# Patient Record
Sex: Male | Born: 1965 | Race: Black or African American | Hispanic: No | Marital: Married | Smoking: Current every day smoker
Health system: Southern US, Community
[De-identification: ages and names within clinical notes are randomized; demographics above are authoritative.]

## PROBLEM LIST (undated history)

## (undated) DIAGNOSIS — I10 Essential (primary) hypertension: Secondary | ICD-10-CM

## (undated) DIAGNOSIS — E119 Type 2 diabetes mellitus without complications: Secondary | ICD-10-CM

## (undated) HISTORY — DX: Essential (primary) hypertension: I10

## (undated) HISTORY — PX: HERNIA REPAIR: SHX51

---

## 2019-11-20 DIAGNOSIS — E119 Type 2 diabetes mellitus without complications: Secondary | ICD-10-CM | POA: Insufficient documentation

## 2019-11-20 DIAGNOSIS — E349 Endocrine disorder, unspecified: Secondary | ICD-10-CM | POA: Insufficient documentation

## 2019-11-23 DIAGNOSIS — N529 Male erectile dysfunction, unspecified: Secondary | ICD-10-CM | POA: Insufficient documentation

## 2019-11-23 DIAGNOSIS — M545 Low back pain, unspecified: Secondary | ICD-10-CM | POA: Insufficient documentation

## 2020-03-31 ENCOUNTER — Other Ambulatory Visit: Payer: Self-pay

## 2020-03-31 ENCOUNTER — Ambulatory Visit
Admission: EM | Admit: 2020-03-31 | Discharge: 2020-03-31 | Disposition: A | Discharge status: Home / Self Care | Payer: BC Managed Care – PPO

## 2020-03-31 ENCOUNTER — Encounter: Payer: Self-pay | Admitting: Emergency Medicine

## 2020-03-31 DIAGNOSIS — M25472 Effusion, left ankle: Secondary | ICD-10-CM

## 2020-03-31 DIAGNOSIS — M25572 Pain in left ankle and joints of left foot: Secondary | ICD-10-CM | POA: Diagnosis not present

## 2020-03-31 HISTORY — DX: Type 2 diabetes mellitus without complications: E11.9

## 2020-03-31 MED ORDER — NAPROXEN 250 MG PO TABS: 500.0000 mg | ORAL_TABLET | Freq: Two times a day (BID) | ORAL | 0 refills | Status: AC

## 2020-03-31 MED ORDER — METHYLPREDNISOLONE SODIUM SUCC 125 MG IJ SOLR
125.0000 mg | Freq: Once | INTRAMUSCULAR | Status: AC
  Administered 2020-03-31: 125 mg via INTRAMUSCULAR

## 2020-03-31 NOTE — ED Notes (Signed)
Patient able to ambulate independently  

## 2020-03-31 NOTE — ED Triage Notes (Signed)
Pt presents to [facility] for assessment of left foot pain, top of foot and at ankle joint, x 3 days.  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  Patient states only pain when bearing weight.

## 2020-03-31 NOTE — ED Provider Notes (Signed)
[facility]    CSN: [id number] Arrival date & time: 03/31/20  1018      History   Chief Complaint Chief Complaint  Patient presents with  . Foot Pain    HPI Evan Jones is a 54 y.o. male with history of type 2 diabetes presenting for left foot pain and swelling.  States has been going for last 3 days.  Denies inciting event, injury.  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  No recent change in diet, medications, lifestyle.  Denies high purine rich diet.  No history of gout or diuretic use.  No numbness.  Has not tried any for this.   Past Medical History:  Diagnosis Date  . Diabetes mellitus without complication (HCC)     There are no problems to display for this patient.   Past Surgical History:  Procedure Laterality Date  . HERNIA REPAIR     2013       Home Medications    Prior to Admission medications   Medication Sig Start Date End Date Taking? Authorizing Provider  metFORMIN (GLUCOPHAGE) 500 MG tablet Take by mouth 2 (two) times daily with a meal.   Yes [provider]  naproxen (NAPROSYN) 250 MG tablet Take 2 tablets (500 mg total) by mouth 2 (two) times daily with a meal. 03/31/20   Hall-Potvin, [place], PA-C    Family History Family History  Problem Relation Age of Onset  . Healthy Mother   . Healthy Father     Social History Social History   Tobacco Use  . Smoking status: Current Every Day Smoker    Packs/day: 0.50  . Smokeless tobacco: Never Used  Substance Use Topics  . Alcohol use: Not Currently  . Drug use: Never     Allergies   Patient has no known allergies.   Review of Systems As per HPI   Physical Exam Triage Vital Signs ED Triage Vitals  Enc Vitals Group     BP 03/31/20 1117 (!) 137/91     Pulse Rate 03/31/20 1117 67     Resp 03/31/20 1117 18     Temp 03/31/20 1117 98 F (36.7 C)     Temp Source 03/31/20 1117 Temporal     SpO2  03/31/20 1117 93 %     Weight --      Height --      Head Circumference --      Peak Flow --      Pain Score 03/31/20 1116 6     Pain Loc --      Pain Edu? --      Excl. in GC? --    No data found.  Updated Vital Signs BP (!) 137/91 (BP Location: Left Arm)   Pulse 67   Temp 98 F (36.7 C) (Temporal)   Resp 18   SpO2 93%   Visual Acuity Right Eye Distance:   Left Eye Distance:   Bilateral Distance:    Right Eye Near:   Left Eye Near:    Bilateral Near:     Physical Exam Constitutional:      General: He is not in acute distress. HENT:     Head: Normocephalic and atraumatic.  Eyes:     General: No scleral icterus.    Pupils: Pupils are equal, round, and reactive to light.  Cardiovascular:     Rate and Rhythm: Normal rate.  Pulmonary:  Effort: Pulmonary effort is normal. No respiratory distress.     Breath sounds: No wheezing.  Musculoskeletal:        General: Swelling and tenderness present.     Comments: Slightly decreased dorsiflexion in left ankle as compared to right second pain.  Patient has mild left great toe MTP tenderness without swelling.  Patient does have proximal dorsal foot tenderness with mild erythema, warmth.  No crepitus, bruising.  Skin:    Coloration: Skin is not jaundiced or pale.     Findings: Erythema present.  Neurological:     Mental Status: He is alert and oriented to person, place, and time.      UC Treatments / Results  Labs (all labs ordered are listed, but only abnormal results are displayed) Labs Reviewed - No data to display  EKG   Radiology No results found.  Procedures Procedures (including critical care time)  Medications Ordered in UC Medications  methylPREDNISolone sodium succinate (SOLU-MEDROL) 125 mg/2 mL injection 125 mg (has no administration in time range)    Initial Impression / Assessment and Plan / UC Course  I have reviewed the triage vital signs and the nursing notes.  Pertinent labs & imaging  results that were available during my care of the patient were reviewed by me and considered in my medical decision making (see chart for details).     Patient without trauma or injury, bony deformity or tenderness: Radiography deferred at this time.  ? Tendinitis VS gout VS pseudogout.  Given Solu-Medrol in office which he tolerated well.  Provided orthopedic contact information if needed.  Return precautions discussed, pt verbalized understanding and is agreeable to plan. Final Clinical Impressions(s) / UC Diagnoses   Final diagnoses:  Pain and swelling of left ankle     Discharge Instructions     RICE: rest, ice, compression, elevation as needed for pain.   Cold therapy (ice packs) can be used to help swelling both after injury and after prolonged use of areas of chronic pain/aches.  Pain medication:  500 mg Naprosyn/Aleve (naproxen) every 12 hours with food:  AVOID other NSAIDs while taking this (may have Tylenol).  Important to follow up with specialist(s) below for further evaluation/management if your symptoms persist or worsen.    ED Prescriptions    Medication Sig Dispense Auth. Provider   naproxen (NAPROSYN) 250 MG tablet Take 2 tablets (500 mg total) by mouth 2 (two) times daily with a meal. 28 tablet Hall-Potvin, [place], PA-C     PDMP not reviewed this encounter.   Hall-Potvin, [place], [place] 03/31/20 1211

## 2020-03-31 NOTE — Discharge Instructions (Signed)
RICE: rest, ice, compression, elevation as needed for pain.   Cold therapy (ice packs) can be used to help swelling both after injury and after prolonged use of areas of chronic pain/aches.  Pain medication:  500 mg Naprosyn/Aleve (naproxen) every 12 hours with food:  AVOID other NSAIDs while taking this (may have Tylenol).  Important to follow up with specialist(s) below for further evaluation/management if your symptoms persist or worsen. 

## 2020-04-19 ENCOUNTER — Emergency Department (HOSPITAL_COMMUNITY): Payer: BC Managed Care – PPO

## 2020-04-19 ENCOUNTER — Encounter (HOSPITAL_COMMUNITY): Payer: Self-pay | Admitting: Emergency Medicine

## 2020-04-19 ENCOUNTER — Emergency Department (HOSPITAL_COMMUNITY)
Admission: EM | Admit: 2020-04-19 | Discharge: 2020-04-19 | Disposition: A | Discharge status: Home / Self Care | Payer: BC Managed Care – PPO | Attending: Emergency Medicine | Admitting: Emergency Medicine

## 2020-04-19 DIAGNOSIS — S39012A Strain of muscle, fascia and tendon of lower back, initial encounter: Secondary | ICD-10-CM | POA: Diagnosis not present

## 2020-04-19 DIAGNOSIS — Y999 Unspecified external cause status: Secondary | ICD-10-CM | POA: Diagnosis not present

## 2020-04-19 DIAGNOSIS — Y939 Activity, unspecified: Secondary | ICD-10-CM | POA: Diagnosis not present

## 2020-04-19 DIAGNOSIS — F17200 Nicotine dependence, unspecified, uncomplicated: Secondary | ICD-10-CM | POA: Diagnosis not present

## 2020-04-19 DIAGNOSIS — Y92410 Unspecified street and highway as the place of occurrence of the external cause: Secondary | ICD-10-CM | POA: Diagnosis not present

## 2020-04-19 DIAGNOSIS — E119 Type 2 diabetes mellitus without complications: Secondary | ICD-10-CM | POA: Insufficient documentation

## 2020-04-19 DIAGNOSIS — S8001XA Contusion of right knee, initial encounter: Secondary | ICD-10-CM | POA: Insufficient documentation

## 2020-04-19 DIAGNOSIS — Z7984 Long term (current) use of oral hypoglycemic drugs: Secondary | ICD-10-CM | POA: Diagnosis not present

## 2020-04-19 DIAGNOSIS — Z79899 Other long term (current) drug therapy: Secondary | ICD-10-CM | POA: Insufficient documentation

## 2020-04-19 DIAGNOSIS — S80911A Unspecified superficial injury of right knee, initial encounter: Secondary | ICD-10-CM | POA: Diagnosis present

## 2020-04-19 LAB — CBG MONITORING, ED: Glucose-Capillary: 130 mg/dL — ABNORMAL HIGH (ref 70–99)

## 2020-04-19 IMAGING — CR DG LUMBAR SPINE COMPLETE 4+V
5 series · 5 of 5 positions shown · non-contrast
Comparison: None.

CLINICAL DATA: Posttraumatic lower back pain

EXAM:
LUMBAR SPINE - COMPLETE 4+ VIEW

[t lumbar spine ap]
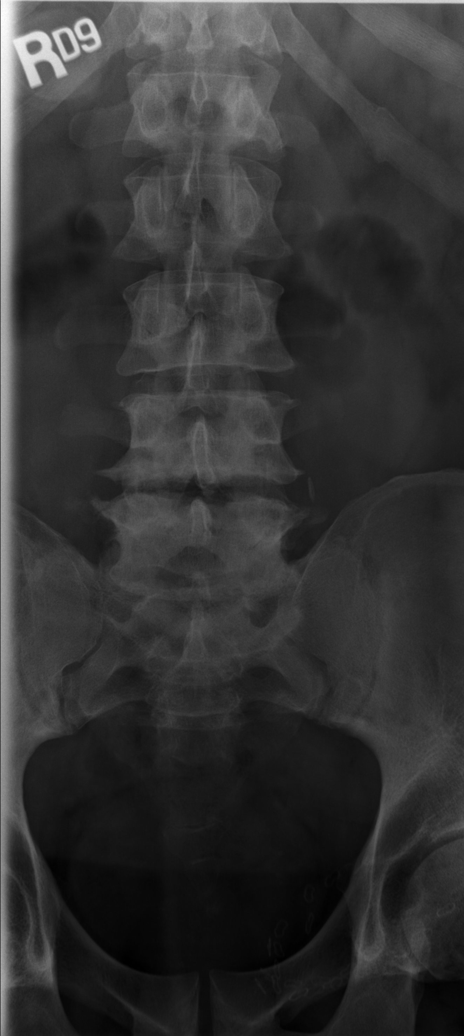

[t lumbar spine obl (1 of 2)]
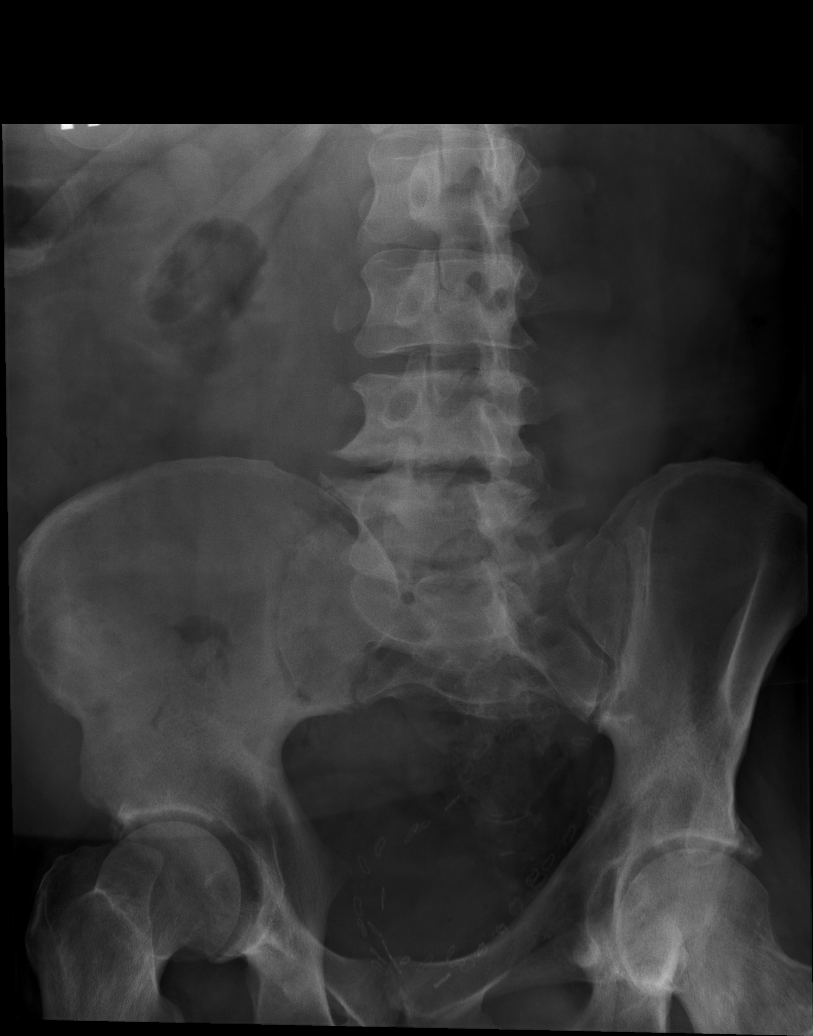

[t lumbar spine obl (2 of 2)]
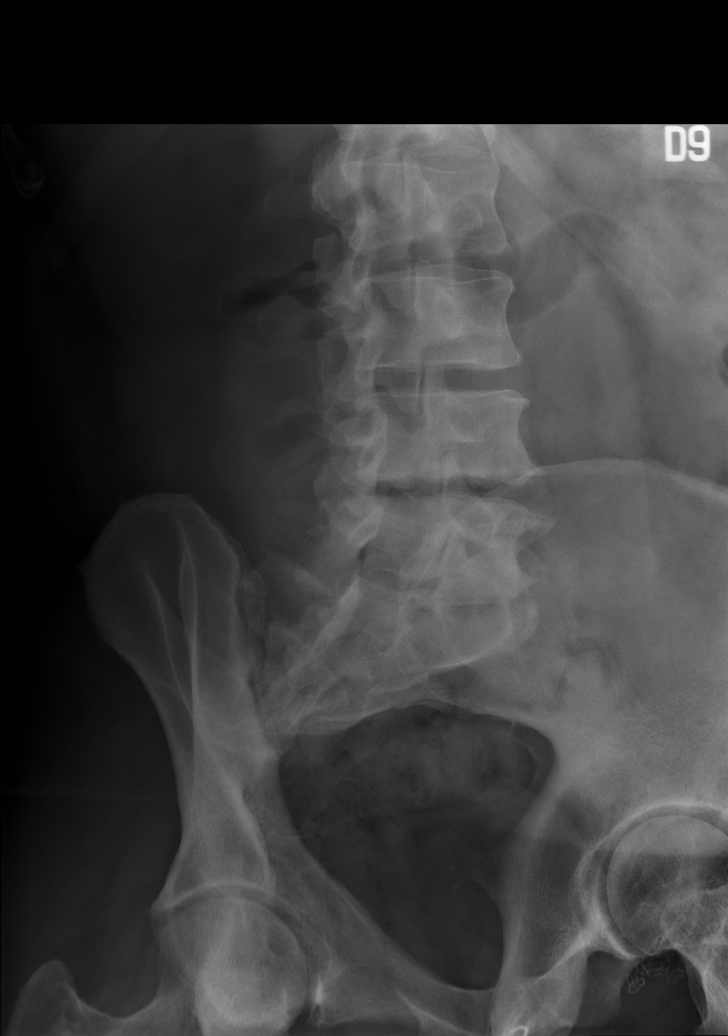

[t lumbar spine lat]
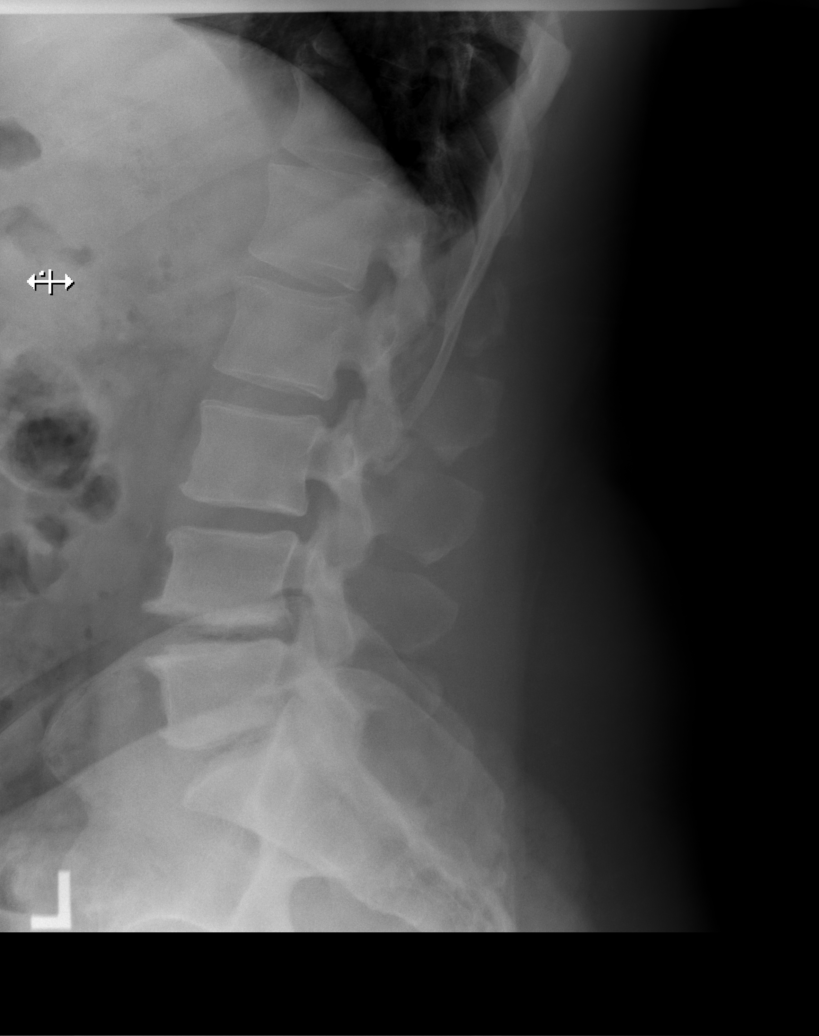

[t lumbar l-5 s-1 spot]
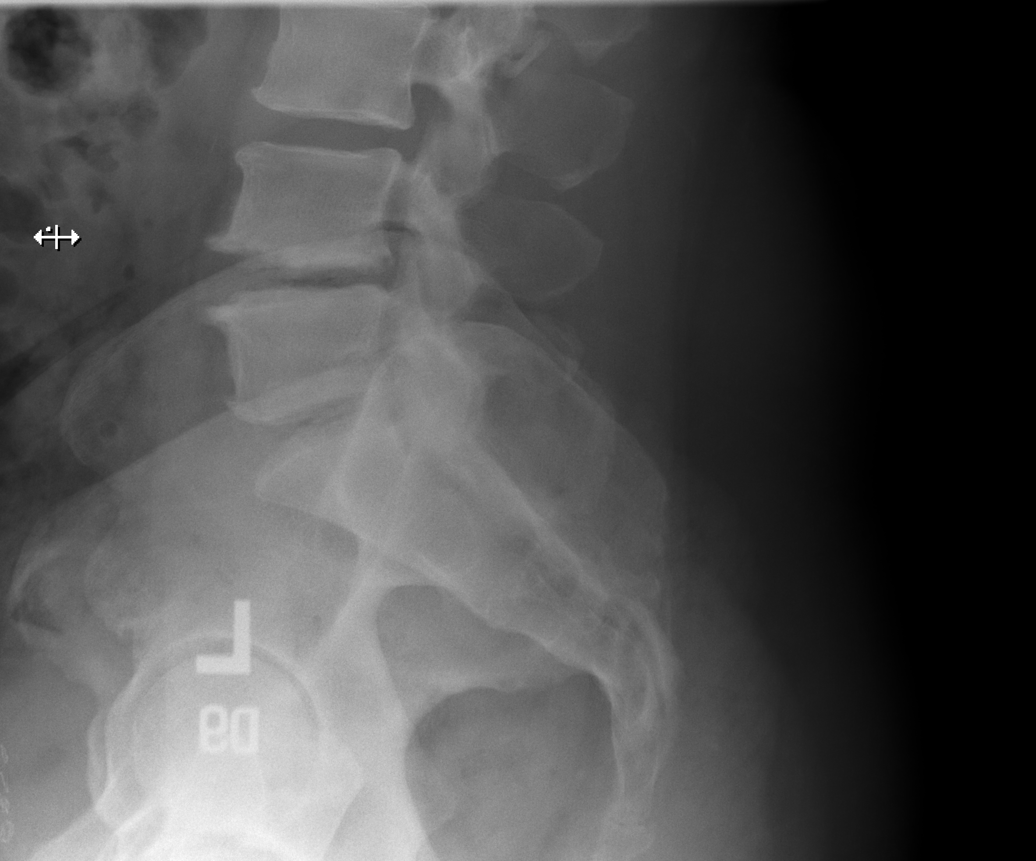

[5 of 5 positions shown; findings below may reference images not displayed]

FINDINGS: There is no evidence of lumbar spine fracture. Alignment is normal.
Disc space narrowing with endplate degeneration and vacuum
phenomenon at L4-5 and L5-S1.
IMPRESSION: 1. No acute finding.
2. L4-5 and L5-S1 disc degeneration.

## 2020-04-19 IMAGING — CR DG KNEE COMPLETE 4+V*R*
5 series · 5 of 5 positions shown · non-contrast
Comparison: None.

CLINICAL DATA: Right lateral knee pain

EXAM:
RIGHT KNEE - COMPLETE 4+ VIEW

[t knee obl right (1 of 3)]
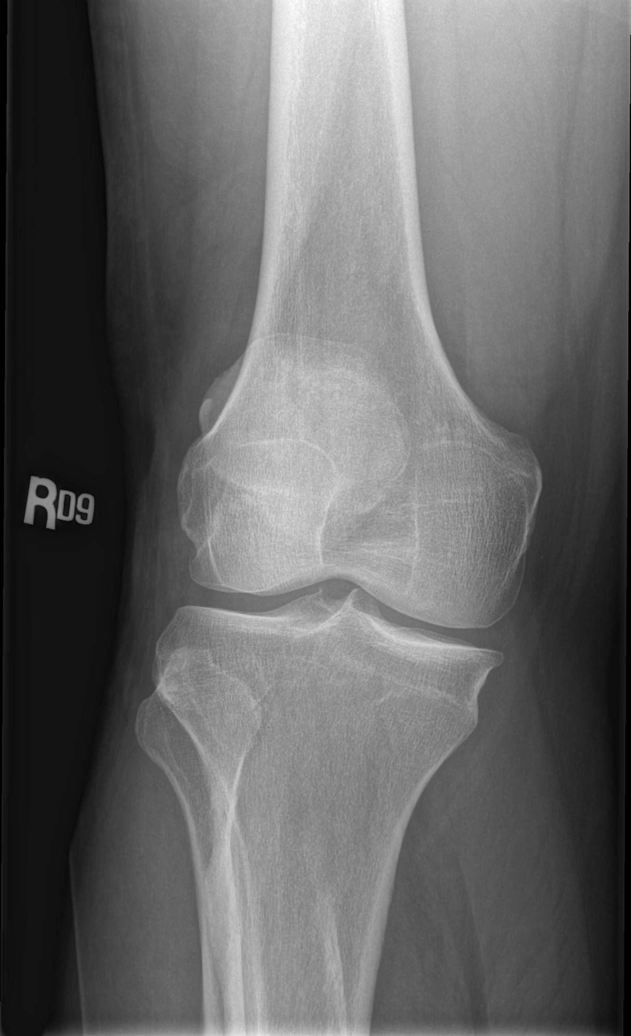

[t knee obl right (2 of 3)]
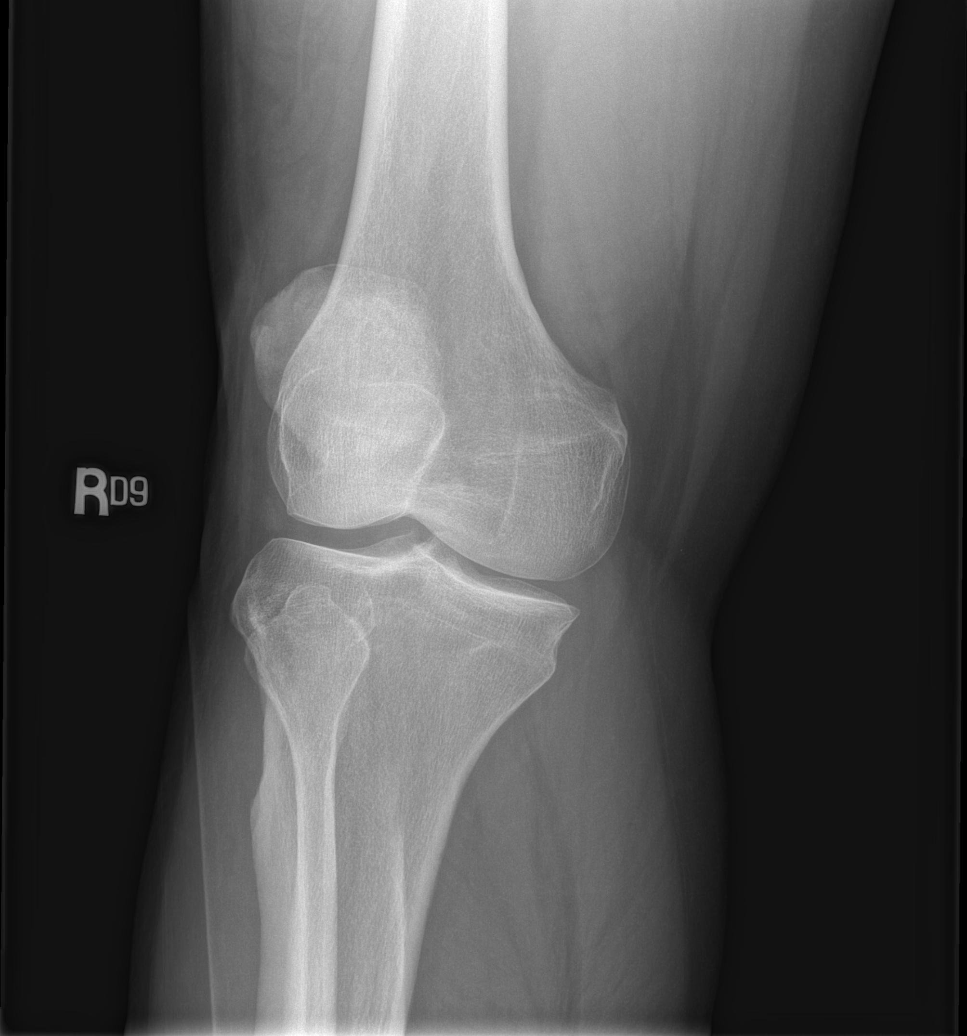

[t knee obl right (3 of 3)]
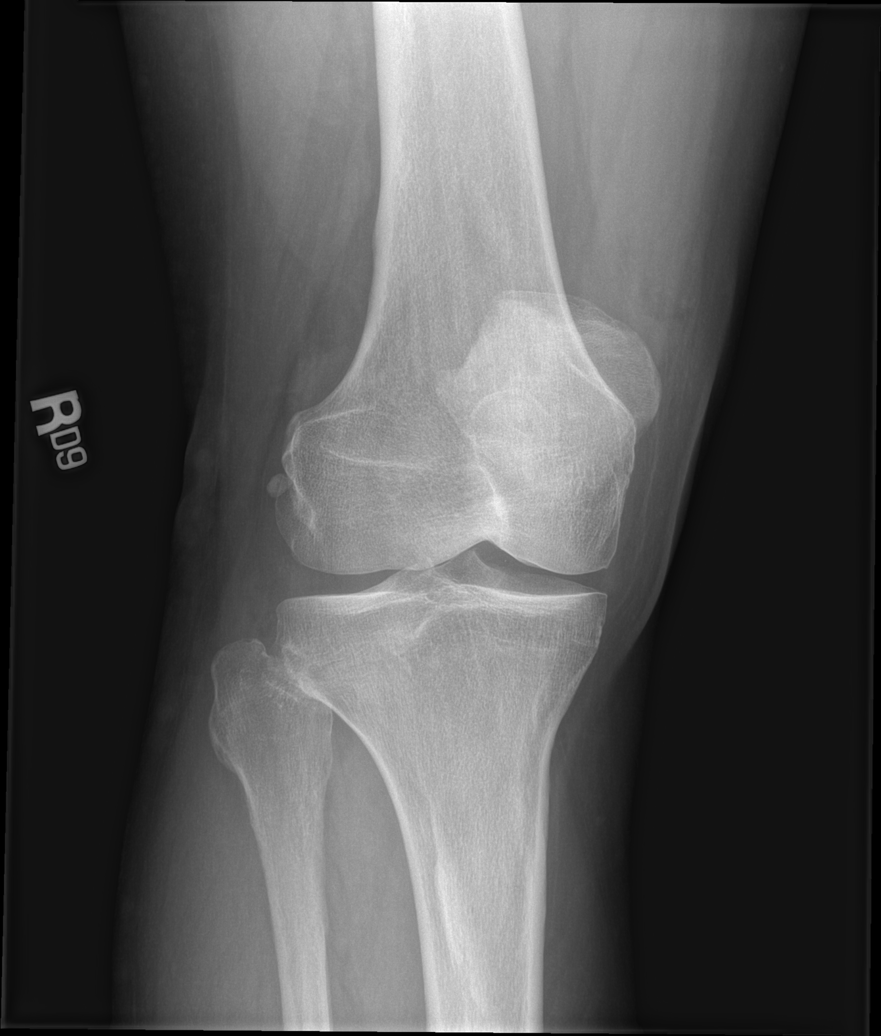

[x knee lat right (1 of 2)]
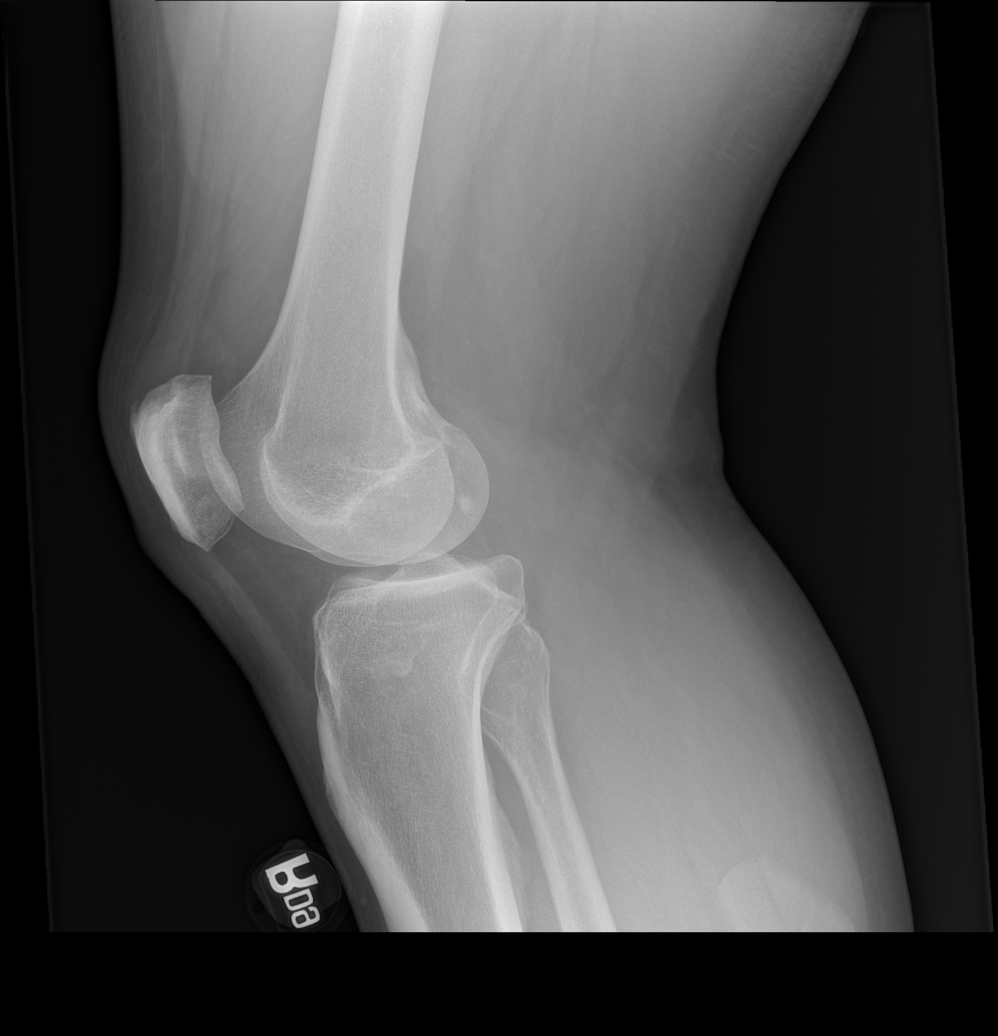

[x knee lat right (2 of 2)]
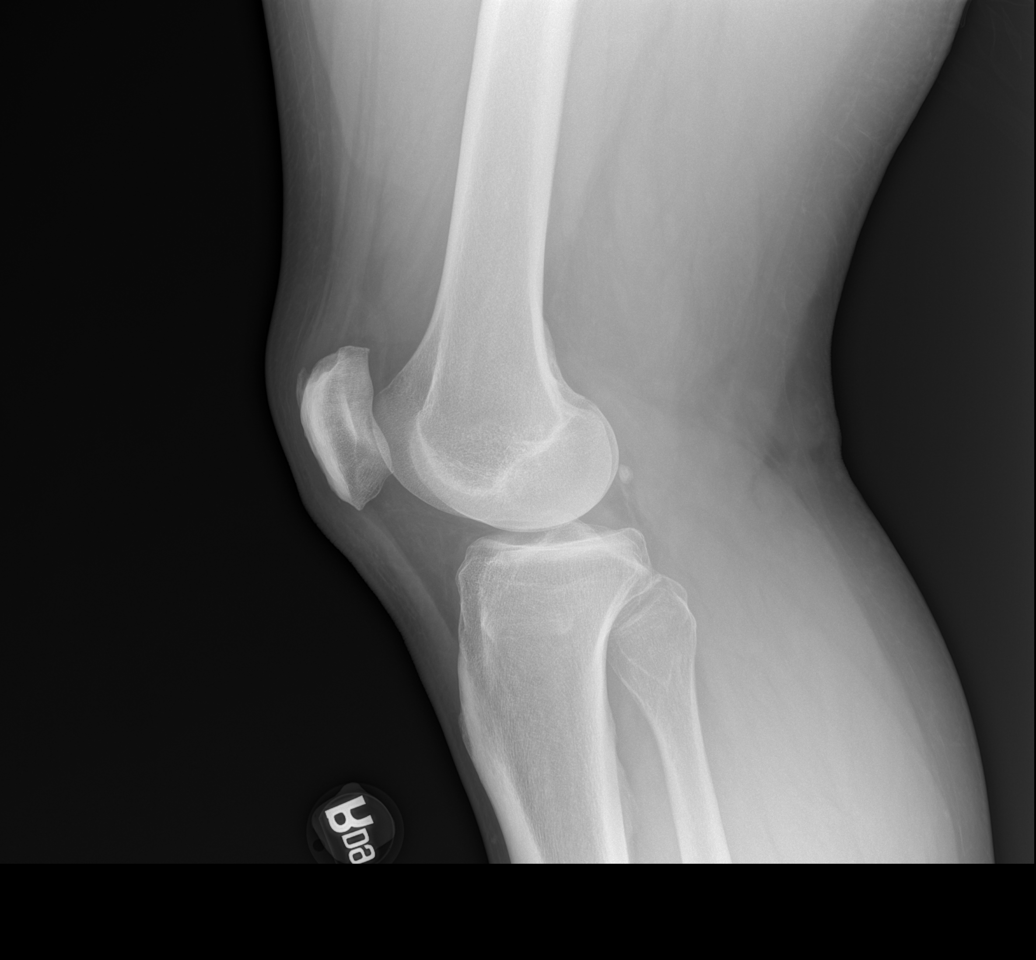

[5 of 5 positions shown; findings below may reference images not displayed]

FINDINGS: No evidence of fracture, dislocation, or joint effusion. No evidence
of arthropathy or other focal bone abnormality. Soft tissues are
unremarkable.
IMPRESSION: Negative.

## 2020-04-19 MED ORDER — CYCLOBENZAPRINE HCL 10 MG PO TABS
10.0000 mg | ORAL_TABLET | Freq: Three times a day (TID) | ORAL | 0 refills | Status: AC
Start: 2020-04-19 — End: 2020-04-26
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
## 2020-04-22 ENCOUNTER — Other Ambulatory Visit: Payer: Self-pay

## 2020-04-22 ENCOUNTER — Encounter: Payer: Self-pay | Admitting: Emergency Medicine

## 2020-04-22 ENCOUNTER — Ambulatory Visit
Admission: EM | Admit: 2020-04-22 | Discharge: 2020-04-22 | Disposition: A | Discharge status: Home / Self Care | Payer: BC Managed Care – PPO

## 2020-04-22 DIAGNOSIS — M545 Low back pain, unspecified: Secondary | ICD-10-CM

## 2020-04-22 DIAGNOSIS — R519 Headache, unspecified: Secondary | ICD-10-CM | POA: Diagnosis not present
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
## 2020-04-22 NOTE — Discharge Instructions (Addendum)
RICE: rest, ice, compression, elevation as needed for pain.    Heat therapy (hot compress, warm wash rag, hot showers, etc.) can help relax muscles and soothe muscle aches. Cold therapy (ice packs) can be used to help swelling both after injury and after prolonged use of areas of chronic pain/aches.  Pain medication:  350 mg-1000 mg of Tylenol (acetaminophen) and/or 200 mg - 800 mg of Advil (ibuprofen, Motrin) every 8 hours as needed.  May alternate between the two throughout the day as they are generally safe to take together.  DO NOT exceed more than 3000 mg of Tylenol or 3200 mg of ibuprofen in a 24 hour period as this could damage your stomach, kidneys, liver, or increase your bleeding risk.   May take muscle relaxer as needed for severe pain / spasm.  (This medication may cause you to become tired so it is important you do not drink alcohol or operate heavy machinery while on this medication.  Recommend your first dose to be taken before bedtime to monitor for side effects safely)  Important to follow up with specialist(s) below for further evaluation/management if your symptoms persist or worsen. 
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
## 2020-06-06 ENCOUNTER — Ambulatory Visit: Payer: BC Managed Care – PPO | Admitting: Neurology

## 2020-06-06 ENCOUNTER — Telehealth: Payer: Self-pay | Admitting: Neurology

## 2020-06-06 NOTE — Telephone Encounter (Signed)
Patient showed up 15 minutes AFTER his appointment start time. Was asked to reschedule. Told the front staff to "shutup" and then held up his middle finger to the front staff. Please dismiss ASAP. Thank you

## 2020-06-11 ENCOUNTER — Encounter: Payer: Self-pay | Admitting: Neurology

## 2020-07-03 DIAGNOSIS — I10 Essential (primary) hypertension: Secondary | ICD-10-CM | POA: Insufficient documentation

## 2020-07-17 DIAGNOSIS — M5459 Other low back pain: Secondary | ICD-10-CM | POA: Insufficient documentation

## 2020-07-17 DIAGNOSIS — M5441 Lumbago with sciatica, right side: Secondary | ICD-10-CM | POA: Insufficient documentation

## 2020-08-13 ENCOUNTER — Ambulatory Visit: Payer: BC Managed Care – PPO | Admitting: Neurology

## 2021-01-29 DIAGNOSIS — K644 Residual hemorrhoidal skin tags: Secondary | ICD-10-CM | POA: Insufficient documentation

## 2021-02-20 DIAGNOSIS — R399 Unspecified symptoms and signs involving the genitourinary system: Secondary | ICD-10-CM | POA: Insufficient documentation

## 2021-02-20 DIAGNOSIS — N5089 Other specified disorders of the male genital organs: Secondary | ICD-10-CM | POA: Insufficient documentation

## 2021-05-05 ENCOUNTER — Other Ambulatory Visit: Payer: Self-pay

## 2021-05-05 ENCOUNTER — Emergency Department (HOSPITAL_COMMUNITY)
Admission: EM | Admit: 2021-05-05 | Discharge: 2021-05-05 | Disposition: A | Discharge status: Home / Self Care | Payer: BC Managed Care – PPO | Attending: Emergency Medicine | Admitting: Emergency Medicine

## 2021-05-05 ENCOUNTER — Emergency Department (HOSPITAL_COMMUNITY): Payer: BC Managed Care – PPO

## 2021-05-05 ENCOUNTER — Encounter (HOSPITAL_COMMUNITY): Payer: Self-pay | Admitting: Emergency Medicine

## 2021-05-05 DIAGNOSIS — E119 Type 2 diabetes mellitus without complications: Secondary | ICD-10-CM | POA: Insufficient documentation

## 2021-05-05 DIAGNOSIS — R200 Anesthesia of skin: Secondary | ICD-10-CM | POA: Diagnosis present

## 2021-05-05 DIAGNOSIS — Z7984 Long term (current) use of oral hypoglycemic drugs: Secondary | ICD-10-CM | POA: Insufficient documentation

## 2021-05-05 DIAGNOSIS — R202 Paresthesia of skin: Secondary | ICD-10-CM | POA: Diagnosis not present

## 2021-05-05 DIAGNOSIS — F17210 Nicotine dependence, cigarettes, uncomplicated: Secondary | ICD-10-CM | POA: Insufficient documentation

## 2021-05-05 LAB — I-STAT CHEM 8, ED
BUN: 10 mg/dL (ref 6–20)
Calcium, Ion: 1.16 mmol/L (ref 1.15–1.40)
Chloride: 107 mmol/L (ref 98–111)
Creatinine, Ser: 0.9 mg/dL (ref 0.61–1.24)
Glucose, Bld: 135 mg/dL — ABNORMAL HIGH (ref 70–99)
HCT: 44 % (ref 39.0–52.0)
Hemoglobin: 15 g/dL (ref 13.0–17.0)
Potassium: 4 mmol/L (ref 3.5–5.1)
Sodium: 142 mmol/L (ref 135–145)
TCO2: 23 mmol/L (ref 22–32)

## 2021-05-05 LAB — CBC WITH DIFFERENTIAL/PLATELET
Abs Immature Granulocytes: 0.01 10*3/uL (ref 0.00–0.07)
Basophils Absolute: 0 10*3/uL (ref 0.0–0.1)
Basophils Relative: 1 %
Eosinophils Absolute: 0.1 10*3/uL (ref 0.0–0.5)
Eosinophils Relative: 3 %
HCT: 46.7 % (ref 39.0–52.0)
Hemoglobin: 14.3 g/dL (ref 13.0–17.0)
Immature Granulocytes: 0 %
Lymphocytes Relative: 45 %
Lymphs Abs: 2.2 10*3/uL (ref 0.7–4.0)
MCH: 24.4 pg — ABNORMAL LOW (ref 26.0–34.0)
MCHC: 30.6 g/dL (ref 30.0–36.0)
MCV: 79.7 fL — ABNORMAL LOW (ref 80.0–100.0)
Monocytes Absolute: 0.4 10*3/uL (ref 0.1–1.0)
Monocytes Relative: 9 %
Neutro Abs: 2 10*3/uL (ref 1.7–7.7)
Neutrophils Relative %: 42 %
Platelets: 250 10*3/uL (ref 150–400)
RBC: 5.86 MIL/uL — ABNORMAL HIGH (ref 4.22–5.81)
RDW: 14.8 % (ref 11.5–15.5)
WBC: 4.7 10*3/uL (ref 4.0–10.5)
nRBC: 0 % (ref 0.0–0.2)

## 2021-05-05 LAB — CBG MONITORING, ED: Glucose-Capillary: 141 mg/dL — ABNORMAL HIGH (ref 70–99)

## 2021-05-05 IMAGING — CT CT HEAD W/O CM
4 series · 16 of 47 positions shown, 18 images · non-contrast
Comparison: None.

CLINICAL DATA: Demyelinating disease. Pin and needle sensations at
both hands.

EXAM:
CT HEAD WITHOUT CONTRAST
TECHNIQUE: Contiguous axial images were obtained from the base of the skull
through the vertex without intravenous contrast.

[Series 3: head wo · axial · 0.45mm/px · z∈[-160,-40]mm · 7 of 32 slices shown, 9 images]
[im 4/32  brain]
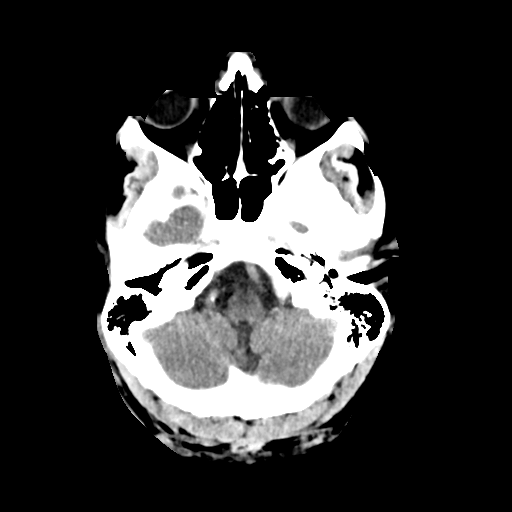
[im 4/32  bone]
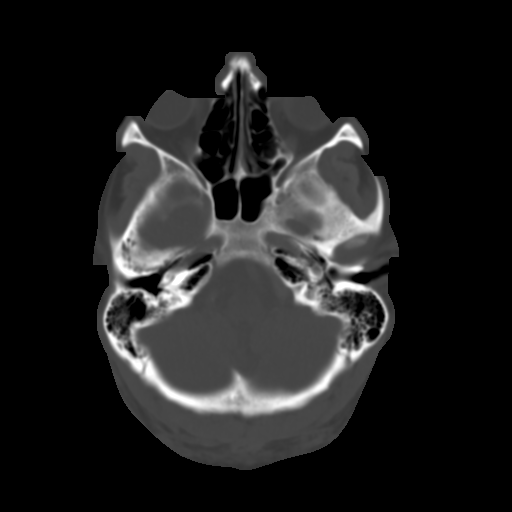
[im 8/32  brain]
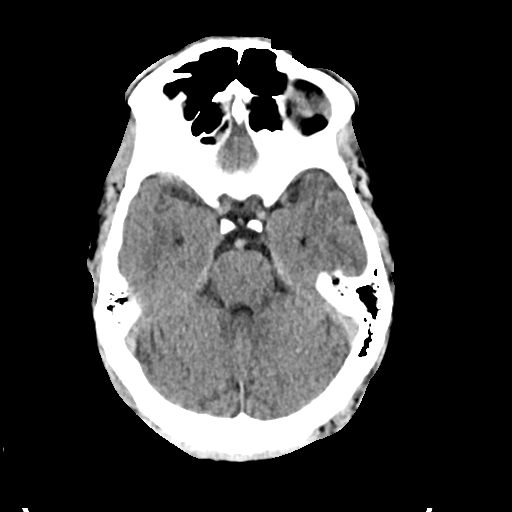
[im 12/32  brain]
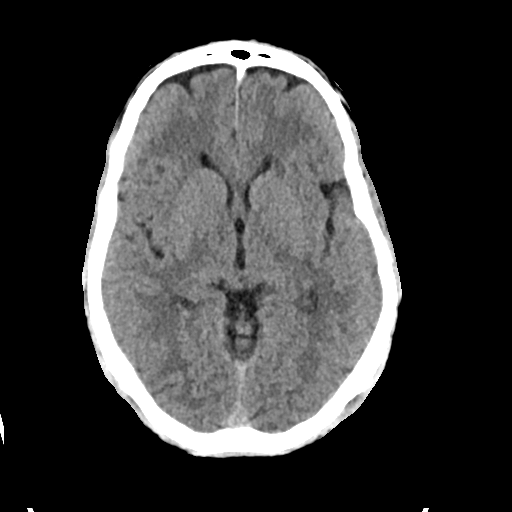
[im 16/32  brain]
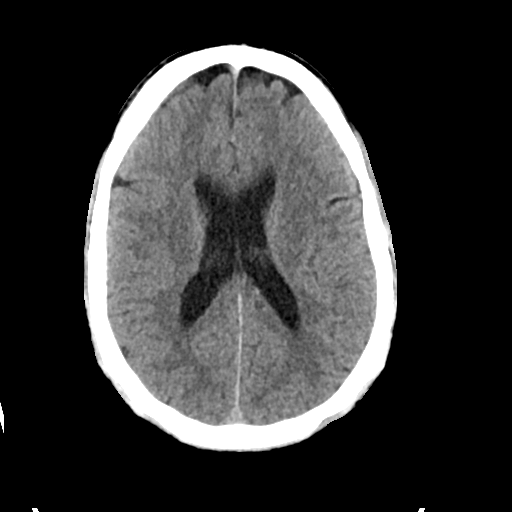
[im 20/32  brain]
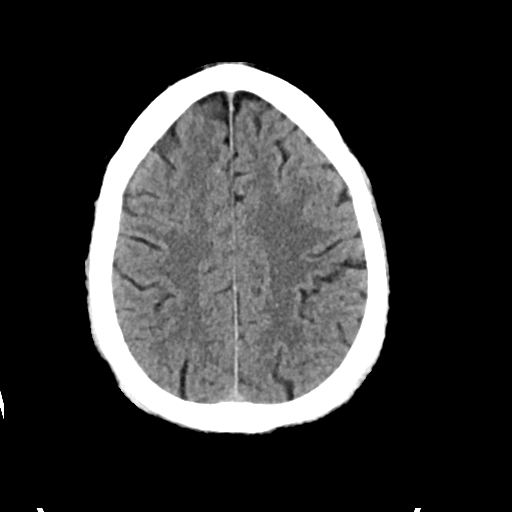
[im 20/32  bone]
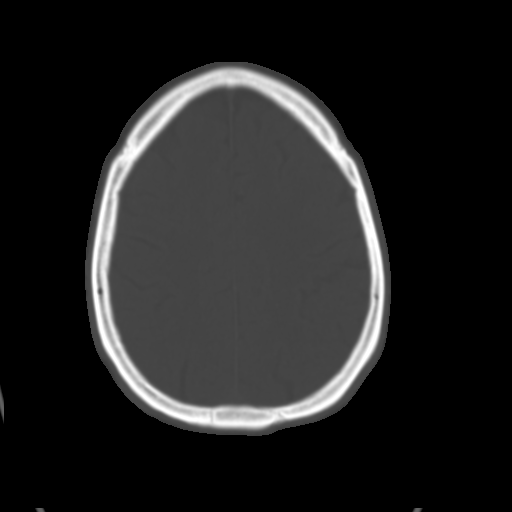
[im 24/32  brain]
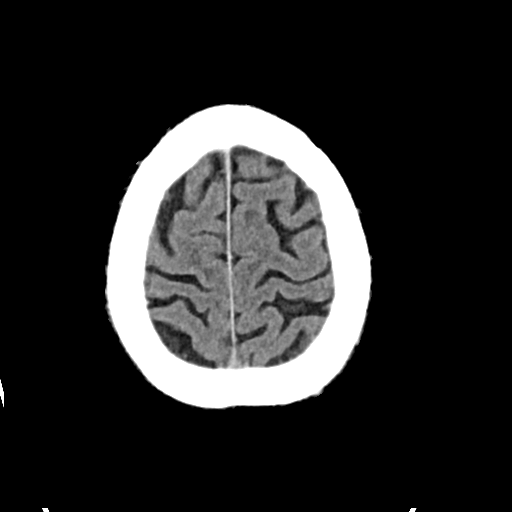
[im 28/32  brain]
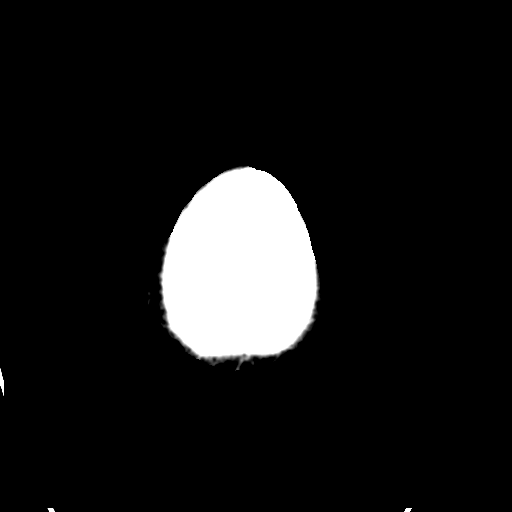

[Series 4: head bone · axial · 0.45mm/px · z∈[-160,-128]mm · 3 of 79 slices shown]
[im 8/79  bone]
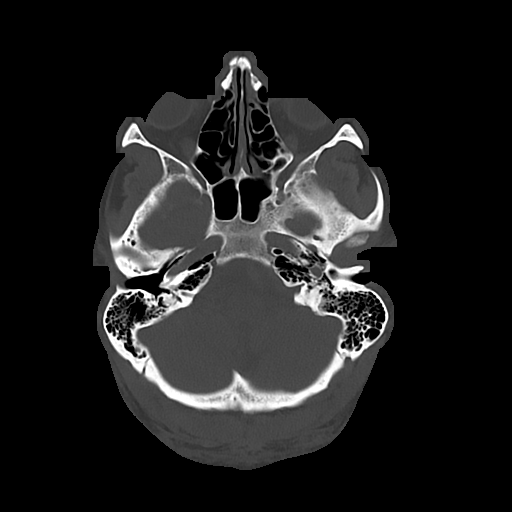
[im 16/79  bone]
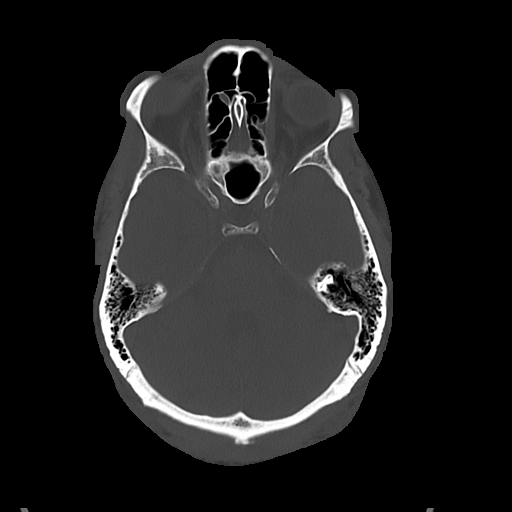
[im 24/79  bone]
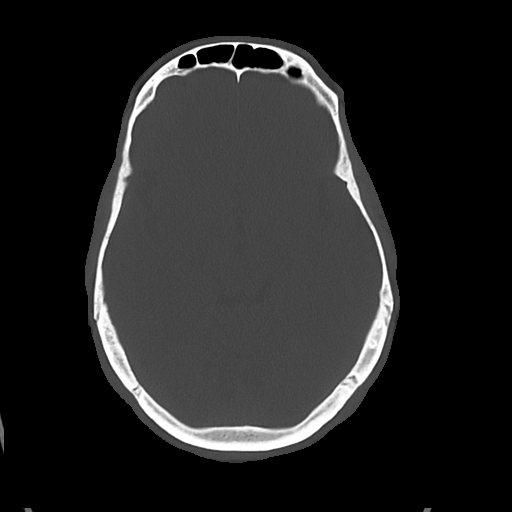

[Series 5: cor soft · coronal · 0.30mm/px · 3 of 74 slices shown]
[im 25/74  brain]
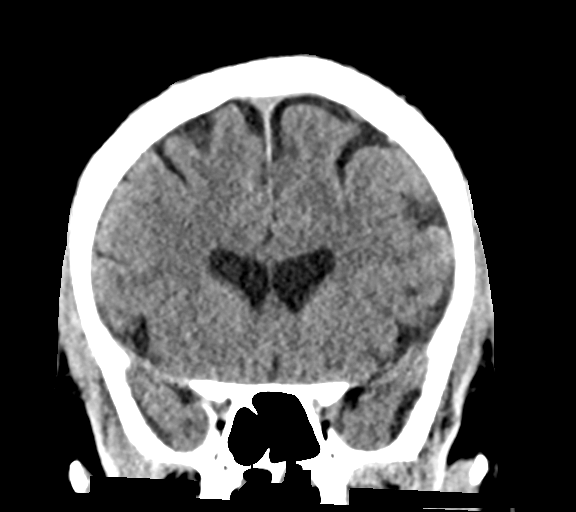
[im 33/74  brain]
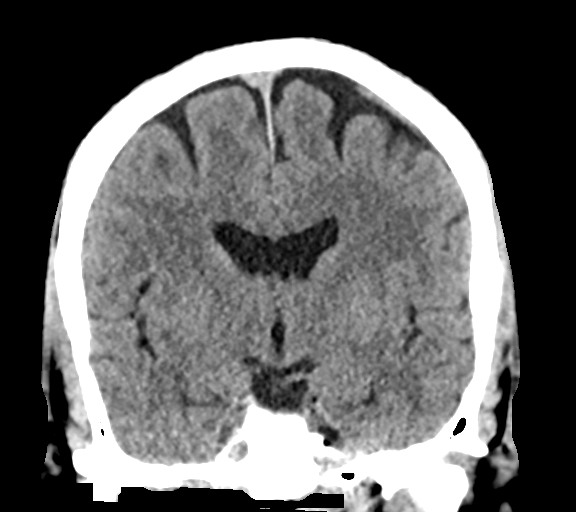
[im 41/74  brain]
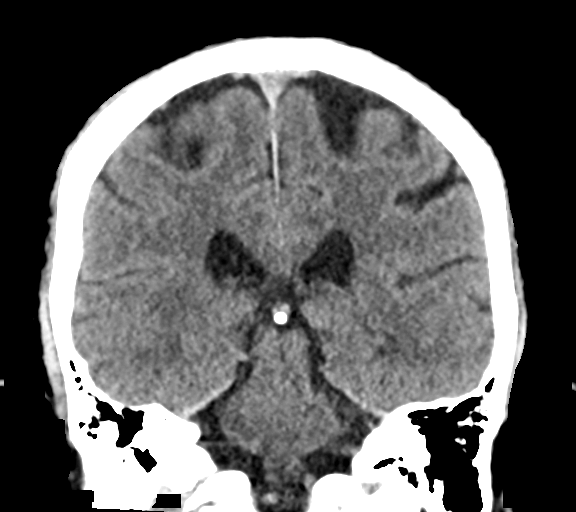

[Series 6: sag soft · sagittal · 0.34mm/px · 3 of 58 slices shown]
[im 20/58  brain]
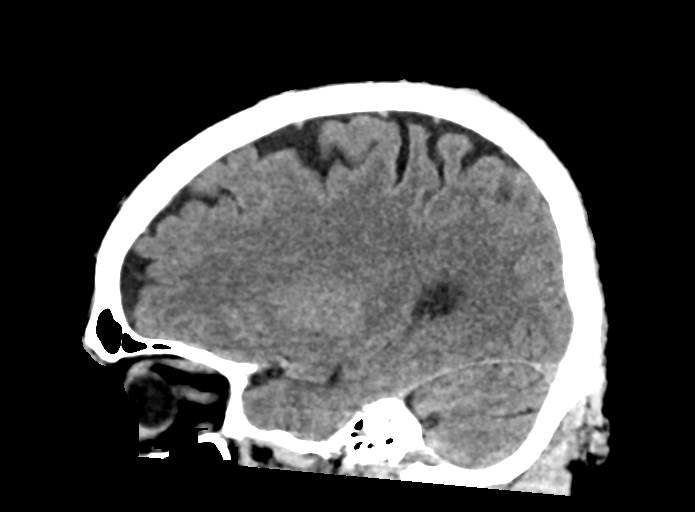
[im 29/58  brain]
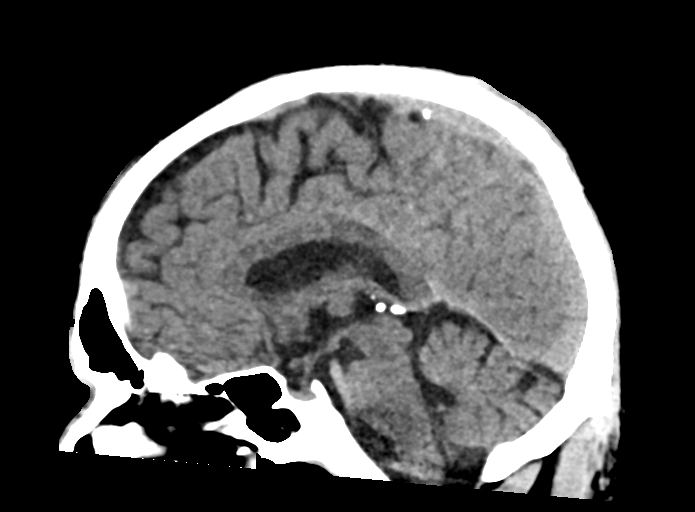
[im 39/58  brain]
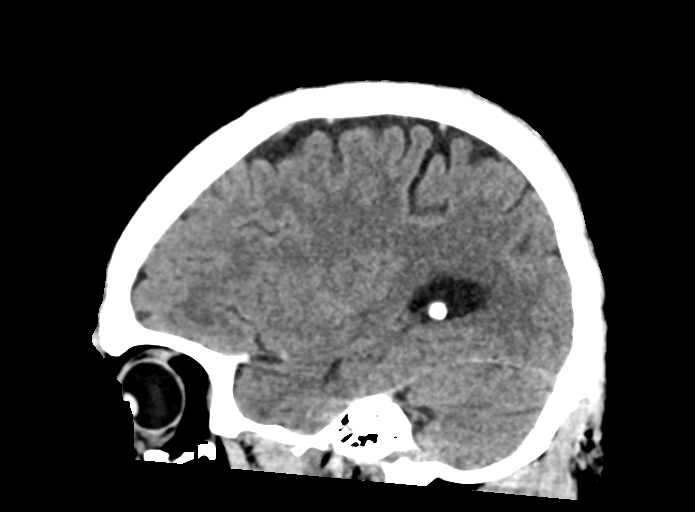

[16 of 47 positions shown; findings below may reference images not displayed]

FINDINGS: Brain: No evidence of acute infarction, hemorrhage, hydrocephalus,
extra-axial collection or mass lesion/mass effect. No white matter
disease or atrophy.

Vascular: No hyperdense vessel or unexpected calcification.

Skull: Normal. Negative for fracture or focal lesion.

Sinuses/Orbits: Remote blowout fracture of the left orbital floor
and lamina papyracea. Mild enophthalmos associated with orbital fat
herniation.
IMPRESSION: Normal appearance of the brain.

## 2021-05-05 NOTE — ED Notes (Signed)
The pt is c/o numbness and tingling in both hands and being in cool temperatures causes extreme pain

## 2021-05-05 NOTE — ED Provider Notes (Signed)
[facility] EMERGENCY DEPARTMENT Provider Note   CSN: [id number] Arrival date & time: 05/05/21  1962     History Chief Complaint  Patient presents with   Numbness    Evan Jones is a 55 y.o. male.  The history is provided by the patient.  Illness Location:  All 10 fingers tips Quality:  Tingling and pins and needles worse in hot water Severity:  Moderate Onset quality:  Gradual Duration: months, worse in 2 weeks. Timing:  Constant Progression:  Worsening Chronicity:  New Context:  Diabetic Relieved by:  Nothing Worsened by:  Time Ineffective treatments:  Nortriptyline Associated symptoms: no abdominal pain, no chest pain, no congestion, no cough, no diarrhea, no ear pain, no fatigue, no fever, no headaches, no loss of consciousness, no myalgias, no nausea, no rash, no rhinorrhea, no shortness of breath, no sore throat, no vomiting and no wheezing   Risk factors:  Diabetes mellitus     Past Medical History:  Diagnosis Date   Diabetes mellitus without complication (HCC)     There are no problems to display for this patient.   Past Surgical History:  Procedure Laterality Date   HERNIA REPAIR     2013       Family History  Problem Relation Age of Onset   Healthy Mother    Healthy Father     Social History   Tobacco Use   Smoking status: Every Day    Packs/day: 0.50    Types: Cigarettes   Smokeless tobacco: Never  Substance Use Topics   Alcohol use: Not Currently   Drug use: Never    Home Medications Prior to Admission medications   Medication Sig Start Date End Date Taking? Authorizing Provider  B Complex-C (SUPER B COMPLEX PO) Take 1 tablet by mouth daily.   Yes [provider]  cyclobenzaprine (FLEXERIL) 10 MG tablet Take 10 mg by mouth 3 (three) times daily as needed for muscle spasms. 04/22/20  Yes [provider]  diclofenac Sodium (VOLTAREN) 1 % GEL Apply 2-4 g topically 2 (two) times daily as needed  (knee pain). 04/25/21  Yes [provider]  metFORMIN (GLUCOPHAGE) 1000 MG tablet Take 1,000 mg by mouth 2 (two) times daily. 04/21/21  Yes [provider]  naproxen sodium (ALEVE) 220 MG tablet Take 440 mg by mouth daily.   Yes [provider]  nortriptyline (PAMELOR) 25 MG capsule Take 25 mg by mouth at bedtime as needed for sleep. 02/02/21  Yes [provider]  tadalafil (CIALIS) 5 MG tablet Take 5 mg by mouth daily. 04/10/21  Yes [provider]  telmisartan (MICARDIS) 40 MG tablet Take 40 mg by mouth daily. 04/21/21  Yes [provider]  naproxen (NAPROSYN) 250 MG tablet Take 2 tablets (500 mg total) by mouth 2 (two) times daily with a meal. Patient not taking: Reported on 05/05/2021 03/31/20   Hall-Potvin, [place], PA-C    Allergies    Patient has no known allergies.  Review of Systems   Review of Systems  Constitutional:  Negative for fatigue and fever.  HENT:  Negative for congestion, ear pain, rhinorrhea and sore throat.   Eyes:  Negative for redness.  Respiratory:  Negative for cough, shortness of breath and wheezing.   Cardiovascular:  Negative for chest pain.  Gastrointestinal:  Negative for abdominal pain, diarrhea, nausea and vomiting.  Genitourinary:  Negative for difficulty urinating.  Musculoskeletal:  Negative for myalgias.  Skin:  Negative for rash.  Neurological:  Negative for dizziness, tremors, seizures, loss of consciousness, syncope, facial asymmetry, speech difficulty, weakness and headaches.  Psychiatric/Behavioral:  Negative for agitation.   All other systems reviewed and are negative.  Physical Exam Updated Vital Signs BP (!) 155/108 (BP Location: Left Arm)   Pulse 72   Temp 98.2 F (36.8 C) (Oral)   Resp 18   Ht 6\' 1"  (1.854 m)   Wt 123.4 kg   SpO2 96%   BMI 35.89 kg/m   Physical Exam Vitals and nursing note reviewed.  Constitutional:      General: He is not in acute distress.    Appearance: Normal  appearance.  HENT:     Head: Normocephalic and atraumatic.     Nose: Nose normal.  Eyes:     Extraocular Movements: Extraocular movements intact.     Conjunctiva/sclera: Conjunctivae normal.     Pupils: Pupils are equal, round, and reactive to light.  Cardiovascular:     Rate and Rhythm: Normal rate and regular rhythm.     Pulses: Normal pulses.     Heart sounds: Normal heart sounds.  Pulmonary:     Effort: Pulmonary effort is normal.     Breath sounds: Normal breath sounds.  Abdominal:     General: Abdomen is flat. Bowel sounds are normal.     Palpations: Abdomen is soft.     Tenderness: There is no abdominal tenderness.  Musculoskeletal:        General: Normal range of motion.     Cervical back: Normal range of motion and neck supple.  Skin:    General: Skin is warm and dry.     Capillary Refill: Capillary refill takes less than 2 seconds.  Neurological:     General: No focal deficit present.     Mental Status: He is alert and oriented to person, place, and time.     Deep Tendon Reflexes: Reflexes normal.  Psychiatric:        Mood and Affect: Mood normal.        Behavior: Behavior normal.    ED Results / Procedures / Treatments   Labs (all labs ordered are listed, but only abnormal results are displayed) Labs Reviewed  CBG MONITORING, ED - Abnormal; Notable for the following components:      Result Value   Glucose-Capillary 141 (*)    All other components within normal limits  CBC WITH DIFFERENTIAL/PLATELET  I-STAT CHEM 8, ED    EKG None  Radiology No results found.  Procedures Procedures   Medications Ordered in ED Medications - No data to display  ED Course  I have reviewed the triage vital signs and the nursing notes.  Pertinent labs & imaging results that were available during my care of the patient were reviewed by me and considered in my medical decision making (see chart for details).   This is not a stroke.  These as paresthesias likely  secondary to diabetes,  it is completely symmetric and patient has sensation and is somewhat hyperesthetic with palpation.  Patient is stable for discharge with close follow up with his PMD.    Evan Jones was evaluated in Emergency Department on 05/05/2021 for the symptoms described in the history of present illness. He was evaluated in the context of the global COVID-19 pandemic, which necessitated consideration that the patient might be at risk for infection with the SARS-CoV-2 virus that causes COVID-19. Institutional protocols and algorithms that pertain to the evaluation of patients at risk for COVID-19 are  in a state of rapid change based on information released by regulatory bodies including the CDC and federal and state organizations. These policies and algorithms were followed during the patient's care in the ED.  Final Clinical Impression(s) / ED Diagnoses Final diagnoses:  Paresthesia   Return for intractable cough, coughing up blood, fevers > 100.4 unrelieved by medication, shortness of breath, intractable vomiting, chest pain, shortness of breath, weakness, numbness, changes in speech, facial asymmetry, abdominal pain, passing out, Inability to tolerate liquids or food, cough, altered mental status or any concerns. No signs of systemic illness or infection. The patient is nontoxic-appearing on exam and vital signs are within normal limits. I have reviewed the triage vital signs and the nursing notes. Pertinent labs & imaging results that were available during my care of the patient were reviewed by me and considered in my medical decision making (see chart for details). After history, exam, and medical workup I feel the patient has been appropriately medically screened and is safe for discharge home. Pertinent diagnoses were discussed with the patient. Patient was given return precautions. Rx / DC Orders ED Discharge Orders     None        Alda Gaultney, MD 05/05/21 1950

## 2021-05-05 NOTE — ED Triage Notes (Signed)
Pt reports numbness in all of his finger tips X2 weeks.  Pt states when he puts his hands in water it feels like needles in fingers

## 2021-06-29 ENCOUNTER — Other Ambulatory Visit: Payer: Self-pay

## 2021-06-29 ENCOUNTER — Ambulatory Visit
Admission: EM | Admit: 2021-06-29 | Discharge: 2021-06-29 | Disposition: A | Discharge status: Home / Self Care | Payer: BC Managed Care – PPO | Attending: Physician Assistant | Admitting: Physician Assistant

## 2021-06-29 DIAGNOSIS — J069 Acute upper respiratory infection, unspecified: Secondary | ICD-10-CM

## 2021-06-29 LAB — POCT INFLUENZA A/B
Influenza A, POC: NEGATIVE
Influenza B, POC: NEGATIVE

## 2021-06-29 NOTE — ED Provider Notes (Signed)
[facility]    CSN: [id number] Arrival date & time: 06/29/21  1229      History   Chief Complaint Chief Complaint  Patient presents with   Generalized Body Aches    HPI Evan Jones is a 55 y.o. male.   Patient here today for evaluation of body aches, chills, nasal congestion and fever that started 3 days ago. He reports that he has not had much energy and has been sleeping more than normal. He has tried theraflu and nyquil with some improvement of symptoms.   The history is provided by the patient.   Past Medical History:  Diagnosis Date   Diabetes mellitus without complication (HCC)     There are no problems to display for this patient.   Past Surgical History:  Procedure Laterality Date   HERNIA REPAIR     2013       Home Medications    Prior to Admission medications   Medication Sig Start Date End Date Taking? Authorizing Provider  B Complex-C (SUPER B COMPLEX PO) Take 1 tablet by mouth daily.    [provider]  cyclobenzaprine (FLEXERIL) 10 MG tablet Take 10 mg by mouth 3 (three) times daily as needed for muscle spasms. 04/22/20   [provider]  diclofenac Sodium (VOLTAREN) 1 % GEL Apply 2-4 g topically 2 (two) times daily as needed (knee pain). 04/25/21   [provider]  metFORMIN (GLUCOPHAGE) 1000 MG tablet Take 1,000 mg by mouth 2 (two) times daily. 04/21/21   [provider]  naproxen (NAPROSYN) 250 MG tablet Take 2 tablets (500 mg total) by mouth 2 (two) times daily with a meal. Patient not taking: Reported on 05/05/2021 03/31/20   Hall-Potvin, [place], PA-C  naproxen sodium (ALEVE) 220 MG tablet Take 440 mg by mouth daily.    [provider]  nortriptyline (PAMELOR) 25 MG capsule Take 25 mg by mouth at bedtime as needed for sleep. 02/02/21   [provider]  tadalafil (CIALIS) 5 MG tablet Take 5 mg by mouth daily. 04/10/21   [provider]  telmisartan (MICARDIS) 40 MG tablet  Take 40 mg by mouth daily. 04/21/21   [provider]    Family History Family History  Problem Relation Age of Onset   Healthy Mother    Healthy Father     Social History Social History   Tobacco Use   Smoking status: Every Day    Packs/day: 0.50    Types: Cigarettes   Smokeless tobacco: Never  Substance Use Topics   Alcohol use: Not Currently   Drug use: Never     Allergies   Patient has no known allergies.   Review of Systems Review of Systems  Constitutional:  Positive for chills, fatigue and fever.  HENT:  Positive for congestion. Negative for ear pain and sore throat.   Eyes:  Negative for discharge and redness.  Respiratory:  Positive for cough. Negative for shortness of breath.   Gastrointestinal:  Negative for abdominal pain, nausea and vomiting.  Skin:  Positive for color change and wound.  Neurological:  Negative for numbness.    Physical Exam Triage Vital Signs ED Triage Vitals  Enc Vitals Group     BP 06/29/21 1358 (!) 147/97     Pulse Rate 06/29/21 1358 79     Resp 06/29/21 1358 18     Temp 06/29/21 1358 98.3 F (36.8 C)     Temp Source 06/29/21 1358 Oral  SpO2 06/29/21 1358 95 %     Weight --      Height --      Head Circumference --      Peak Flow --      Pain Score 06/29/21 1400 10     Pain Loc --      Pain Edu? --      Excl. in GC? --    No data found.  Updated Vital Signs BP (!) 147/97 (BP Location: Left Arm)   Pulse 79   Temp 98.3 F (36.8 C) (Oral)   Resp 18   SpO2 95%      Physical Exam Vitals and nursing note reviewed.  Constitutional:      General: He is not in acute distress.    Appearance: Normal appearance. He is not ill-appearing.  HENT:     Head: Normocephalic and atraumatic.  Eyes:     Conjunctiva/sclera: Conjunctivae normal.  Cardiovascular:     Rate and Rhythm: Normal rate and regular rhythm.     Heart sounds: Normal heart sounds. No murmur heard. Pulmonary:     Effort: Pulmonary effort is  normal. No respiratory distress.     Breath sounds: Normal breath sounds. No wheezing, rhonchi or rales.  Neurological:     Mental Status: He is alert.  Psychiatric:        Mood and Affect: Mood normal.        Behavior: Behavior normal.        Thought Content: Thought content normal.     UC Treatments / Results  Labs (all labs ordered are listed, but only abnormal results are displayed) Labs Reviewed  COVID-19, FLU A+B NAA  POCT INFLUENZA A/B    EKG   Radiology No results found.  Procedures Procedures (including critical care time)  Medications Ordered in UC Medications - No data to display  Initial Impression / Assessment and Plan / UC Course  I have reviewed the triage vital signs and the nursing notes.  Pertinent labs & imaging results that were available during my care of the patient were reviewed by me and considered in my medical decision making (see chart for details).   Rapid flu negative in office. Will order Covid and flu PCR. Will await results for further recommendation. Ok to continue symptomatic treatment in the meantime. Encouraged follow up with any further concerns.   Final Clinical Impressions(s) / UC Diagnoses   Final diagnoses:  Acute upper respiratory infection   Discharge Instructions   None    ED Prescriptions   None    PDMP not reviewed this encounter.   Tomi Bamberger, PA-C 06/29/21 [id number]

## 2021-06-29 NOTE — ED Triage Notes (Signed)
Pt present body aches with chills and nasal congestion and fever. Symptoms started Friday night. Pt state he does not have any energy and just wants to sleep

## 2021-06-30 LAB — COVID-19, FLU A+B NAA
Influenza A, NAA: NOT DETECTED
Influenza B, NAA: NOT DETECTED
SARS-CoV-2, NAA: DETECTED — AB

## 2021-08-29 ENCOUNTER — Encounter: Payer: Self-pay | Admitting: Neurology

## 2021-09-26 ENCOUNTER — Encounter: Payer: Self-pay | Admitting: Neurology

## 2021-09-26 ENCOUNTER — Other Ambulatory Visit: Payer: Self-pay

## 2021-09-26 ENCOUNTER — Other Ambulatory Visit (INDEPENDENT_AMBULATORY_CARE_PROVIDER_SITE_OTHER): Payer: BC Managed Care – PPO

## 2021-09-26 ENCOUNTER — Ambulatory Visit (INDEPENDENT_AMBULATORY_CARE_PROVIDER_SITE_OTHER): Payer: BC Managed Care – PPO | Admitting: Neurology

## 2021-09-26 VITALS — BP 168/111 | HR 75 | Ht 73.0 in | Wt 258.0 lb

## 2021-09-26 DIAGNOSIS — R9089 Other abnormal findings on diagnostic imaging of central nervous system: Secondary | ICD-10-CM | POA: Diagnosis not present

## 2021-09-26 DIAGNOSIS — G959 Disease of spinal cord, unspecified: Secondary | ICD-10-CM | POA: Diagnosis not present

## 2021-09-26 MED ORDER — GABAPENTIN 300 MG PO CAPS: ORAL_CAPSULE | ORAL | 1 refills | Status: DC

## 2021-09-26 NOTE — Patient Instructions (Addendum)
Start gabapentin 300mg  - Take 1 tablet in the morning and 4 tablets at bedtime  MRI brain and thoracic spine wwo contrast will be ordered  Check labs

## 2021-09-26 NOTE — Progress Notes (Signed)
[facility] Clinic Note - Initial Visit   Date: 09/26/21  Evan Jones MRN: [id number] DOB: Feb 02, 1966   Dear Dr. Grandville Silos:  Thank you for your kind referral of Evan Jones for consultation of abnormal cervical cord. Although his history is well known to you, please allow [place] to reiterate it for the purpose of our medical record. The patient was accompanied to the clinic by self.    History of Present Illness: Evan Jones is a 56 y.o. right-handed male with diabetes mellitus and hypertension presenting for evaluation of abnormal cervical cord. On August 23rd, he slipped off the truck and landed on the concrete which caused knee pain. He did not recall injuring his neck. On September 4th, he was laying in the bed and began having tingling in his fingers and toes, but spared this thumbs. He went to [facility] ER  where CT head without contrast was normal.  Since Septmber, he began having numbness/tingling involving the entire left arm and leg.  He has difficulty with walking because of leg weakness, especially getting up from a chair or climbing steps. He has fallen three times. No cramps.  He had MRI cervical spine on 12/3 which showed T2 hyperintensity involving the cord at C5-6, along with moderate disc protrusion at this level. Repeat contrasted MRI on 12/30 shows T2 signal abnormality at C5-6 with small enhancement suggesting acute inflammatory process.  He is a short distance Administrator.  He has one son in [place].    He smokes 0.5 PPD.  No alcohol.    Out-side paper records, electronic medical record, and images have been reviewed where available and summarized as:  MRI cervical spine wwo contrast 08/29/2021: Persistent area of marked T2 and STIR signal abnormality in the cervical cord at the C5-6 level. There is a small amount of enhancement in the are suggesting a more acute or active inflammatory process.  Suspect this is an area of active demyelination or  inflammation.  Recommend neurology consultation.  MRI cervical spine wo contrast 08/02/2021: T2 hyperintensity and swelling centered at the C5-6 cord,at the level of degenerative cord wasting although the degree of stenosis and cord signal abnormality are somewhat discordant.  Has there been recent cervical spine injury or is there history of demyelinating disease? Suggest post contrast correlate and follow-up imaging.  Degenerative foraminal impingement on theright at C4-5, bilaterally at C5-6,andbilaterally at C7-T1.   Past Medical History:  Diagnosis Date   Diabetes mellitus without complication ([place])    Hypertension     Past Surgical History:  Procedure Laterality Date   HERNIA REPAIR     2013     Medications:  Outpatient Encounter Medications as of 09/26/2021  Medication Sig   B Complex-C (SUPER B COMPLEX PO) Take 1 tablet by mouth daily.   cyclobenzaprine (FLEXERIL) 10 MG tablet Take 10 mg by mouth 3 (three) times daily as needed for muscle spasms.   diclofenac Sodium (VOLTAREN) 1 % GEL Apply 2-4 g topically 2 (two) times daily as needed (knee pain).   metFORMIN (GLUCOPHAGE) 1000 MG tablet Take 1,000 mg by mouth 2 (two) times daily.   naproxen sodium (ALEVE) 220 MG tablet Take 440 mg by mouth daily.   nortriptyline (PAMELOR) 25 MG capsule Take 25 mg by mouth at bedtime as needed for sleep.   tadalafil (CIALIS) 5 MG tablet Take 5 mg by mouth daily.   telmisartan (MICARDIS) 40 MG tablet Take 40 mg by mouth daily.   [DISCONTINUED] gabapentin (NEURONTIN) 300  MG capsule Take by mouth. Take one capsule once a day.   gabapentin (NEURONTIN) 300 MG capsule Take 313m in the morning and 12044mat bedtime   naproxen (NAPROSYN) 250 MG tablet Take 2 tablets (500 mg total) by mouth 2 (two) times daily with a meal. (Patient not taking: Reported on 05/05/2021)   No facility-administered encounter medications on file as of 09/26/2021.    Allergies: No Known Allergies  Family History: Family  History  Problem Relation Age of Onset   Dementia Mother    Healthy Mother    Healthy Father     Social History: Social History   Tobacco Use   Smoking status: Every Day    Packs/day: 0.50    Types: Cigarettes   Smokeless tobacco: Never   Tobacco comments:    Smokes a half a pack a day   Substance Use Topics   Alcohol use: Not Currently   Drug use: Never   Social History   Social History Narrative   Right Handed    Has one son    Lives in a one story home     Vital Signs:  BP (!) 168/111 Comment: Patient states he is in pain   Pulse 75    Ht '6\' 1"'  (1.854 m)    Wt 258 lb (117 kg)    SpO2 97%    BMI 34.04 kg/m   Neurological Exam: MENTAL STATUS including orientation to time, place, person, recent and remote memory, attention span and concentration, language, and fund of knowledge is normal.  Speech is not dysarthric.  CRANIAL NERVES: II:  No visual field defects.   III-IV-VI: Pupils equal round and reactive to light.  Normal conjugate, extra-ocular eye movements in all directions of gaze.  No nystagmus.  No ptosis.   V:  Normal facial sensation.    VII:  Normal facial symmetry and movements.   VIII:  Normal hearing and vestibular function.   IX-X:  Normal palatal movement.   XI:  Normal shoulder shrug and head rotation.   XII:  Normal tongue strength and range of motion, no deviation or fasciculation.  MOTOR:  No atrophy, fasciculations or abnormal movements.  No pronator drift.   Upper Extremity:  Right  Left  Deltoid  5/5   5/5   Biceps  5/5   5/5   Triceps  5/5   5/5   Infraspinatus 5/5  5/5  Medial pectoralis 5/5  5/5  Wrist extensors  5/5   5/5   Wrist flexors  5/5   5/5   Finger extensors  4/5   4/5   Finger flexors  4/5   4/5   Dorsal interossei  4/5   4/5   Abductor pollicis  4/5   4/5   Tone (Ashworth scale)  0  0   Lower Extremity:  Right  Left  Hip flexors  4+/5   4/5   Hip extensors  5/5   5/5   Knee flexors  5/5   5/5   Knee extensors  5/5    5/5   Dorsiflexors  5/5   5/5   Plantarflexors  5/5   5/5   Toe extensors  5/5   5/5   Toe flexors  5/5   5/5   Tone (Ashworth scale)  0  0   MSRs:  Right        Left                  brachioradialis  2+  2+  biceps 2+  2+  triceps 2+  2+  patellar 3+  3+  ankle jerk 2+  2+  Hoffman no  no  plantar response down  down  Bilateral Hoffman signs are present  SENSORY:  Temperature, vibration, and pin prick is reduced on the left arm and leg, compared to the right.  COORDINATION/GAIT: Normal finger-to- nose-finger. Finger tapping is slowed bilaterally. Unable to rise from a chair without using arms. Gait is wide-based, unsteady, ataxic, mild spasticity.   IMPRESSION: Cervical cord abnormality at C5-6 with enhancement is concerning for demyelinating lesion, less likely compressive pathology.  Ddx: transverse myelitis, MS, metabolic etiology vs compression.   PLAN/RECOMMENDATIONS:  MRI brain and thoracic spine wwo contrast Check ESR, CRP, vitamin B12, folate, copper, vitamin E, vitamin D Increase gabapentin 333m in the morning and 12032mat bedtime Consider CSF testing, going forward  Further recommendations pending results.  Total time spent reviewing records, interview, history/exam, documentation, and coordination of care on day of encounter:  80 min   Thank you for allowing me to participate in patient's care.  If I can answer any additional questions, I would be pleased to do so.    Sincerely,    Nadiyah Zeis K. PaPosey ProntoDO

## 2021-09-27 LAB — C-REACTIVE PROTEIN: CRP: 1.7 mg/L (ref <8.0)

## 2021-09-27 LAB — VITAMIN D 25 HYDROXY (VIT D DEFICIENCY, FRACTURES): Vit D, 25-Hydroxy: 38 ng/mL (ref 30–100)

## 2021-09-27 LAB — B12 AND FOLATE PANEL
Folate: 14.2 ng/mL
Vitamin B-12: 368 pg/mL (ref 200–1100)

## 2021-09-27 LAB — SEDIMENTATION RATE: Sed Rate: 2 mm/h (ref 0–20)

## 2021-10-01 LAB — VITAMIN E
Gamma-Tocopherol (Vit E): 1.5 mg/L (ref <=4.3)
Vitamin E (Alpha Tocopherol): 7.8 mg/L (ref 5.7–19.9)

## 2021-10-01 LAB — COPPER, SERUM: Copper: 92 ug/dL (ref 70–175)

## 2021-10-12 ENCOUNTER — Other Ambulatory Visit: Payer: Self-pay

## 2021-10-12 ENCOUNTER — Ambulatory Visit
Admission: RE | Admit: 2021-10-12 | Discharge: 2021-10-12 | Disposition: A | Discharge status: Home / Self Care | Payer: BC Managed Care – PPO | Source: Ambulatory Visit | Attending: Neurology | Admitting: Neurology

## 2021-10-12 DIAGNOSIS — R9089 Other abnormal findings on diagnostic imaging of central nervous system: Secondary | ICD-10-CM

## 2021-10-12 DIAGNOSIS — G959 Disease of spinal cord, unspecified: Secondary | ICD-10-CM

## 2021-10-12 IMAGING — MR MR THORACIC SPINE WO/W CM
5 of 9 series · 25 of 48 positions shown · IV contrast (20 mL Multihance)
Comparison: None.

CLINICAL DATA: Myelopathy, acute, thoracic spine. Demyelinating
disease

EXAM:
MRI THORACIC WITHOUT AND WITH CONTRAST
TECHNIQUE: Multiplanar and multiecho pulse sequences of the thoracic spine were
obtained without and with intravenous contrast.
CONTRAST:  20mL MULTIHANCE GADOBENATE DIMEGLUMINE 529 MG/ML IV SOLN

[Series 17: T1 · sagittal · 3.0mm · 1.04mm/px · 4 of 21 slices shown (1 of 2)]
[im 1/21]
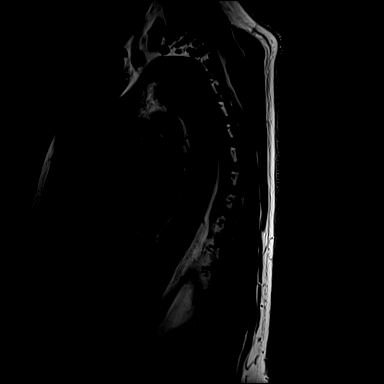
[im 7/21]
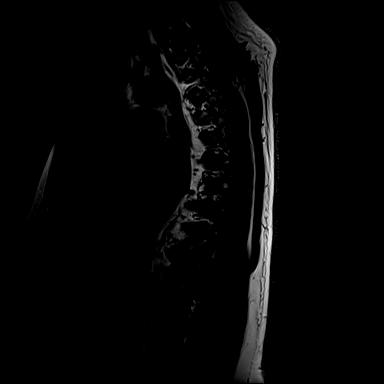
[im 14/21]
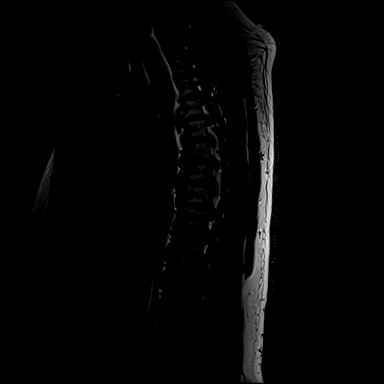
[im 21/21]
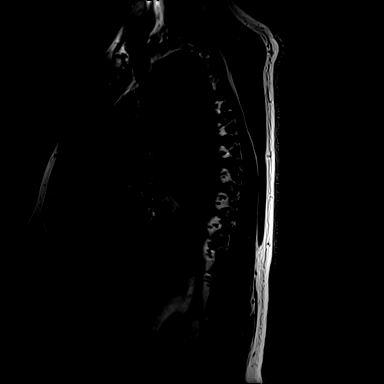

[Series 19: T2 · axial · 4.0mm · 0.28mm/px · z∈[-494,-231]mm · 7 of 39 slices shown (1 of 2)]
[im 1/39]
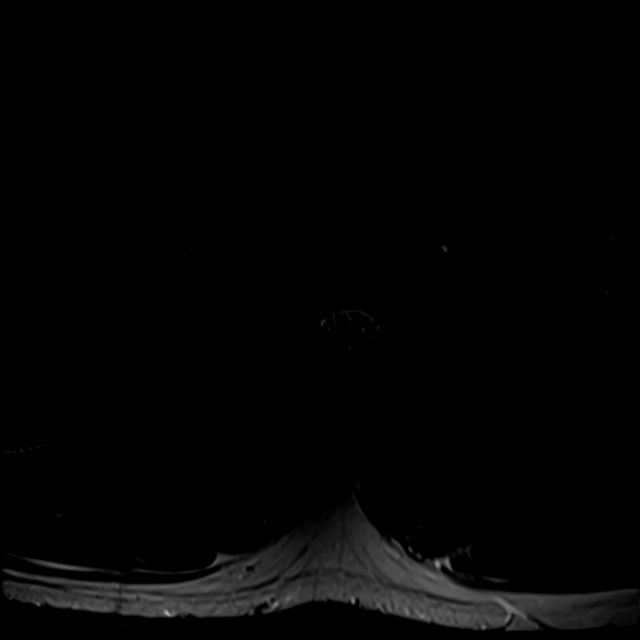
[im 7/39]
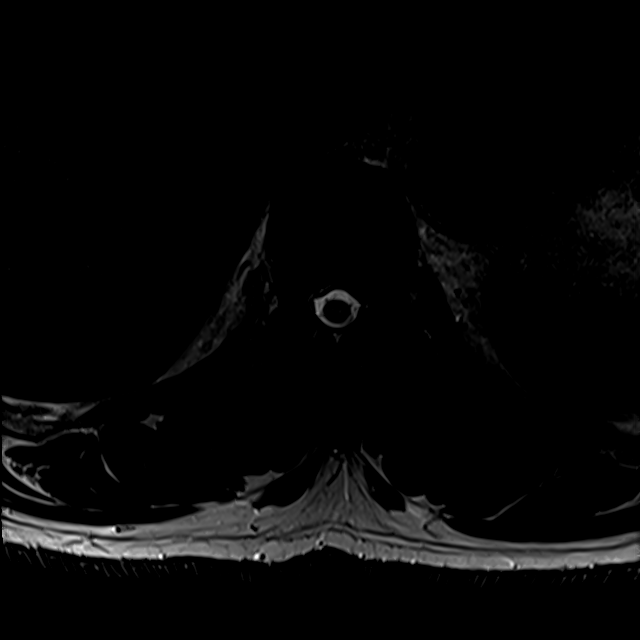
[im 13/39]
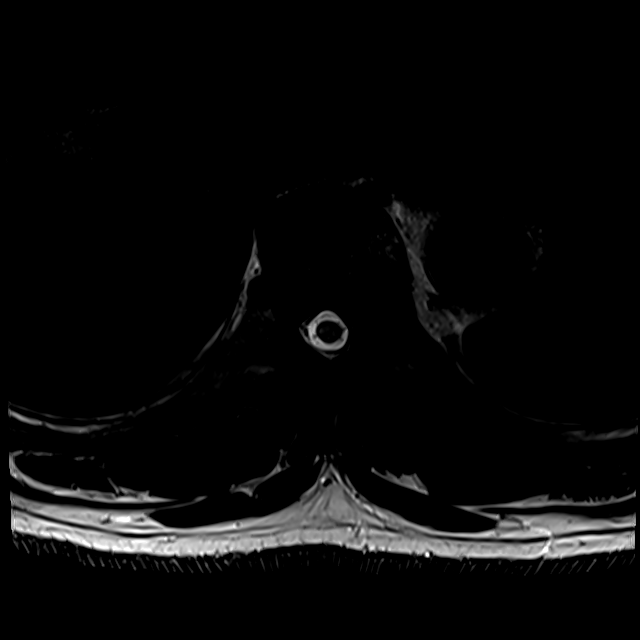
[im 20/39]
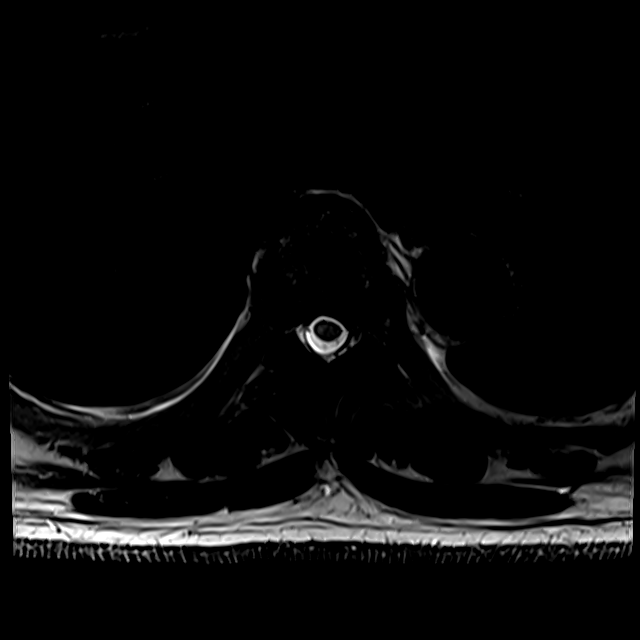
[im 26/39]
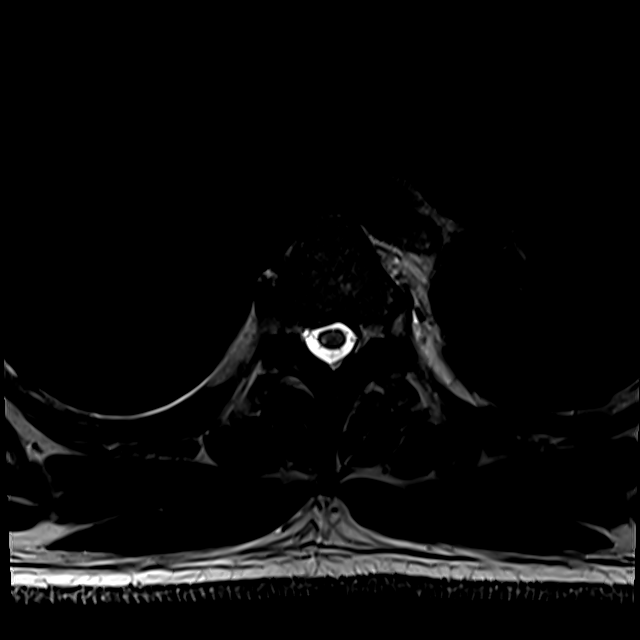
[im 32/39]
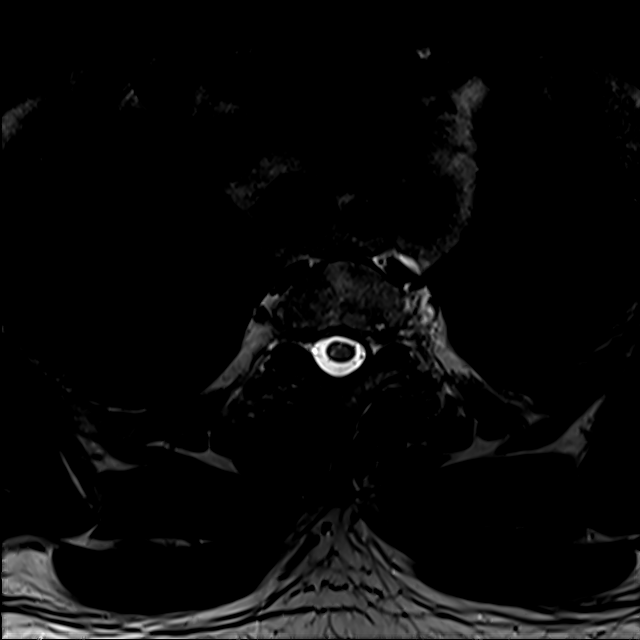
[im 39/39]
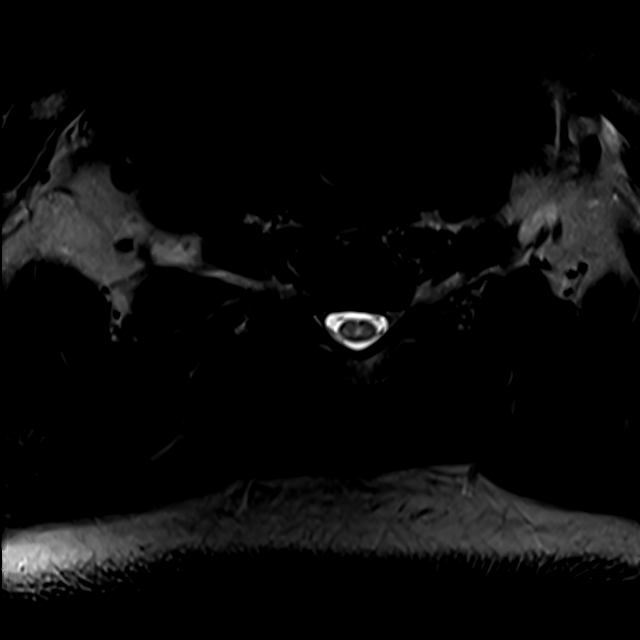

[Series 21: T1 · axial · non-contrast · 4.0mm · 0.56mm/px · z∈[-494,-231]mm · 7 of 39 slices shown (2 of 2)]
[im 1/39]
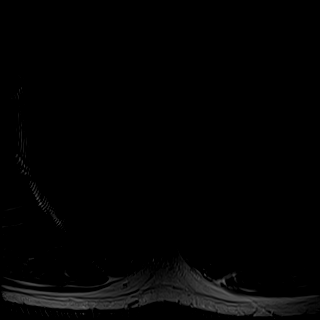
[im 7/39]
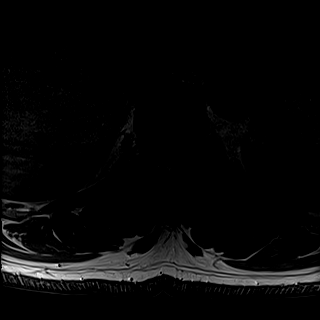
[im 13/39]
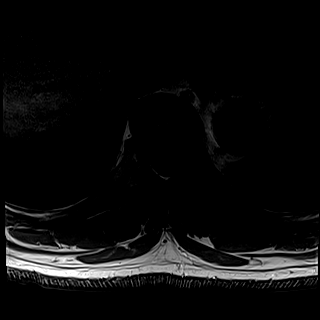
[im 20/39]
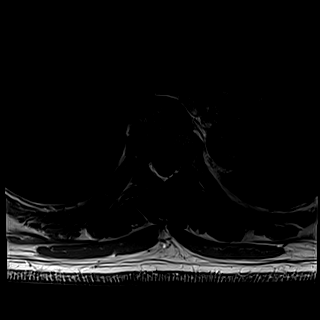
[im 26/39]
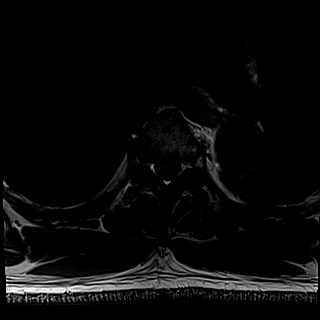
[im 32/39]
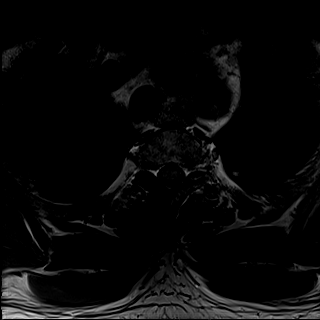
[im 39/39]
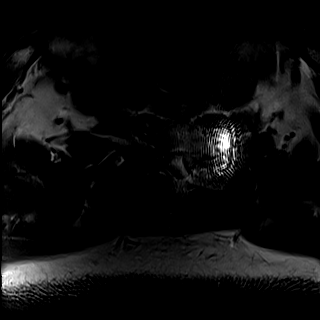

[Series 22: T2 · sagittal · 3.0mm · 1.04mm/px · 4 of 21 slices shown (2 of 2)]
[im 1/21]
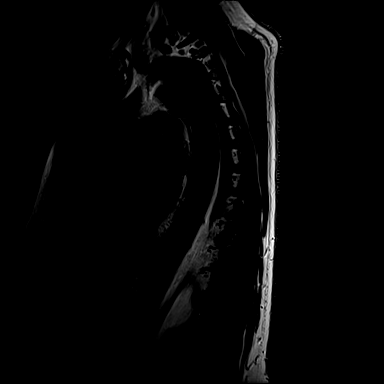
[im 7/21]
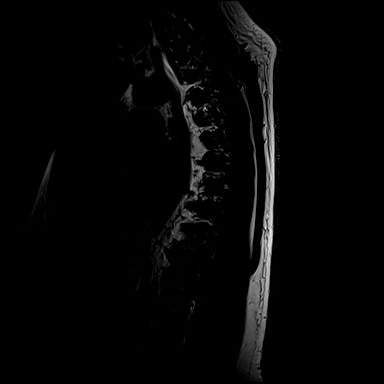
[im 14/21]
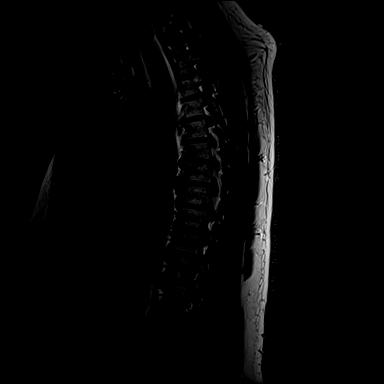
[im 21/21]
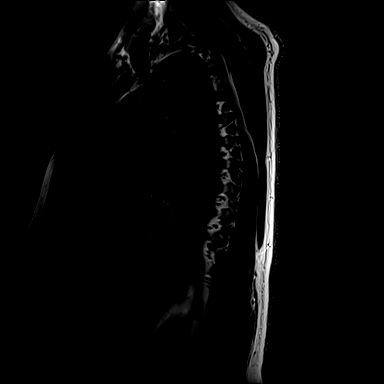

[Series 25: T1 fat-sat · sagittal · 3.0mm · 1.25mm/px · 3 of 21 slices shown]
[im 1/21]
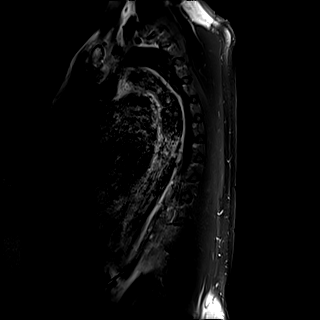
[im 7/21]
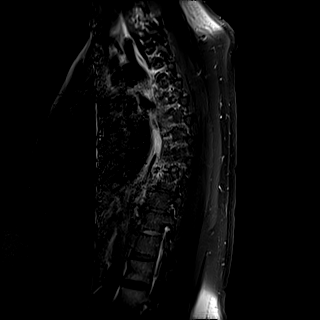
[im 14/21]
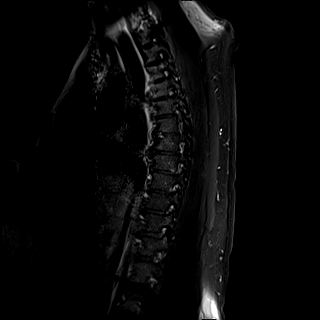

[25 of 48 positions shown; findings below may reference images not displayed]

FINDINGS: Alignment:  No substantial sagittal subluxation.

Vertebrae: Vertebral body heights are maintained. No focal marrow
edema to suggest acute fracture or discitis/osteomyelitis.

Cord: Short-segment T2/stir hyperintensity in the lower cervical
spine, partially imaged. Right eccentric T2 hyperintense cord signal
extending from the imaged lower cervical cord to approximately
T4-T5. Suspected additional subtle T2 hyperintense signal within the
right lateral cord at T7-T8. No evidence of abnormal enhancement in
the thoracic cord.

Paraspinal and other soft tissues: Unremarkable.

Disc levels:

Small disc bulges at multiple levels which partially effaces ventral
CSF without significant canal stenosis. Multilevel facet arthropathy
contributes to multilevel foraminal stenosis, greatest and moderate
bilaterally at T10-T11. Mild to moderate left greater than right
foraminal stenosis at T9-T10. Otherwise, multilevel mild multilevel
foraminal stenosis.
IMPRESSION: 1. Multifocal T2 hyperintense cord lesions, detailed above and
compatible with reported history of demyelinating disease. No
evidence of abnormal enhancement in the thoracic cord.
2. Multilevel degenerative change, detailed above.

## 2021-10-12 IMAGING — MR MR HEAD WO/W CM
14 series · 48 of 48 positions shown · IV contrast (multihance)
Comparison: None.

CLINICAL DATA: Abnormal Cervical Cord/ Evaluate Brain for MS

EXAM:
MRI HEAD WITHOUT AND WITH CONTRAST
TECHNIQUE: Multiplanar, multiecho pulse sequences of the brain and surrounding
structures were obtained without and with intravenous contrast.
CONTRAST:  20mL MULTIHANCE GADOBENATE DIMEGLUMINE 529 MG/ML IV SOLN

[Series 5: T1 · sagittal · 4.0mm · 0.75mm/px · 2 of 28 slices shown (1 of 3)]
[im 1/28]
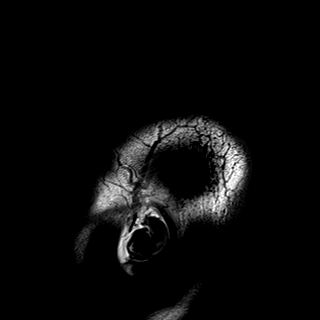
[im 28/28]
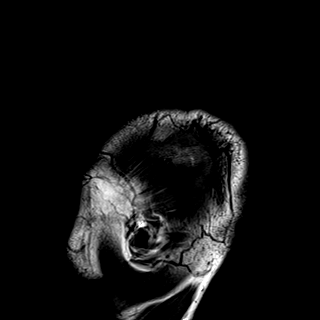

[Series 6: DWI · axial · 3.0mm · 0.94mm/px · z∈[-65,+84]mm · 9 of 172 slices shown (1 of 3)]
[im 1/172]
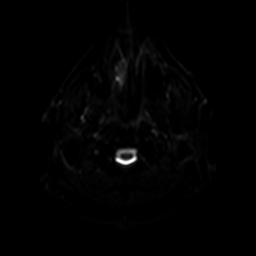
[im 22/172]
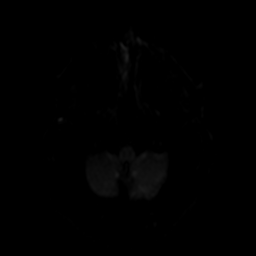
[im 43/172]
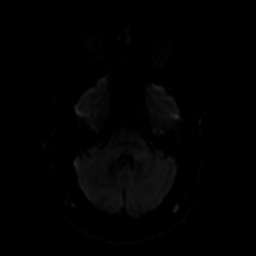
[im 65/172]
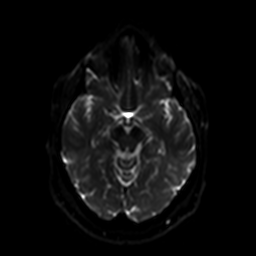
[im 86/172]
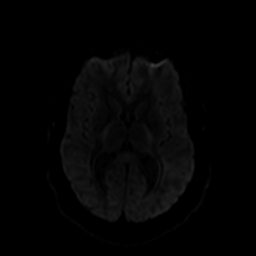
[im 107/172]
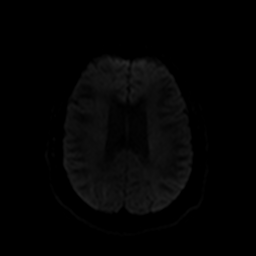
[im 129/172]
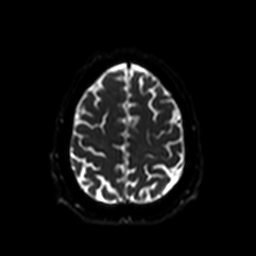
[im 150/172]
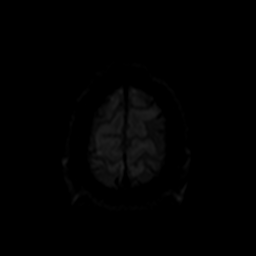
[im 172/172]
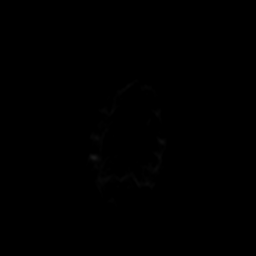

[Series 7: ax dwi_tracew · axial · 3.0mm · 0.94mm/px · z∈[-65,+84]mm · 4 of 86 slices shown]
[im 1/86]
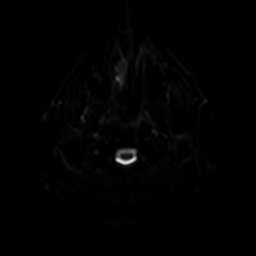
[im 29/86]
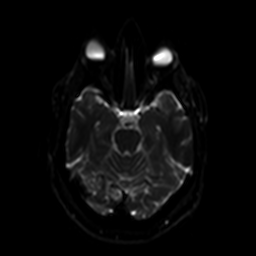
[im 57/86]
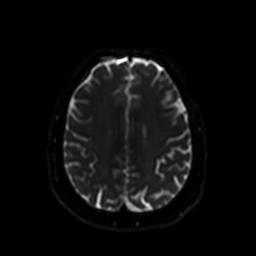
[im 86/86]
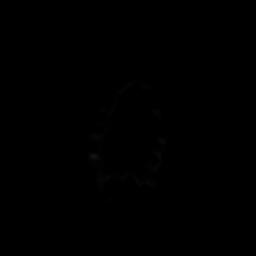

[Series 8: ax dwi_adc · axial · 3.0mm · 0.94mm/px · z∈[-65,+84]mm · 2 of 43 slices shown]
[im 1/43]
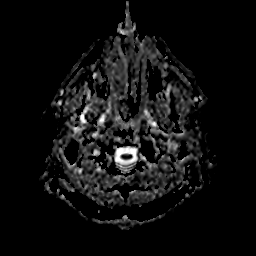
[im 43/43]
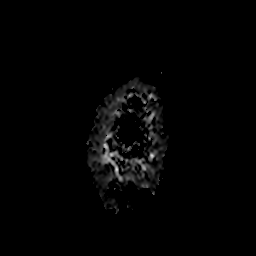

[Series 9: DWI · coronal · 5.0mm · 1.44mm/px · 3 of 68 slices shown (2 of 3)]
[im 1/68]
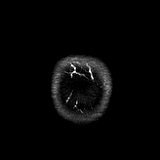
[im 34/68]
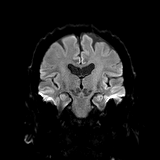
[im 68/68]
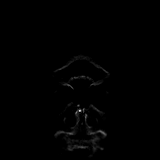

[Series 10: DWI · coronal · 5.0mm · 1.44mm/px · 2 of 34 slices shown (3 of 3)]
[im 1/34]
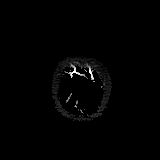
[im 34/34]
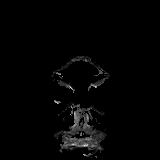

[Series 11: T2 · axial · 4.0mm · 0.36mm/px · 1 of 29 slices shown]
[im 1/29]
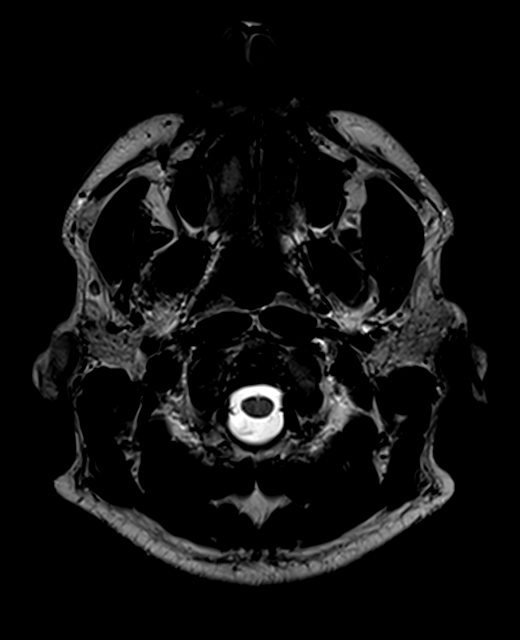

[Series 12: FLAIR · axial · 3.0mm · 0.72mm/px · 1 of 26 slices shown (1 of 2)]
[im 1/26]
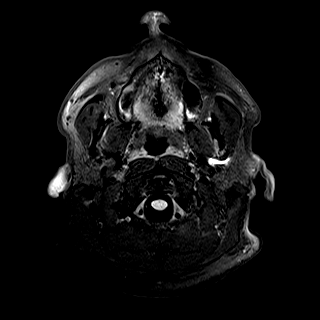

[Series 13: swi_images · axial · 1.5mm · 0.90mm/px · z∈[-68,+73]mm · 5 of 96 slices shown]
[im 1/96]
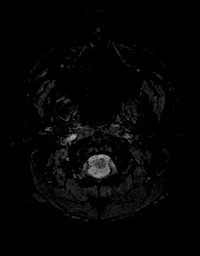
[im 24/96]
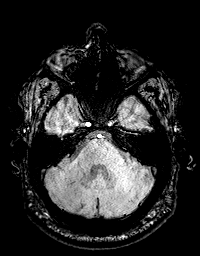
[im 48/96]
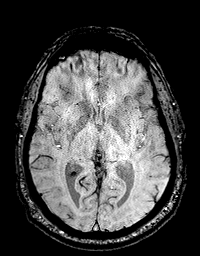
[im 72/96]
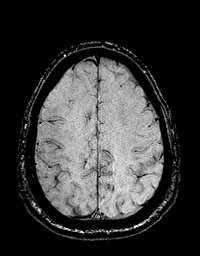
[im 96/96]
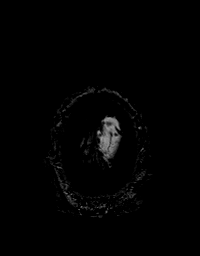

[Series 15: T1 · axial · 1.0mm · 0.94mm/px · z∈[-67,+74]mm · 7 of 144 slices shown (2 of 3)]
[im 1/144]
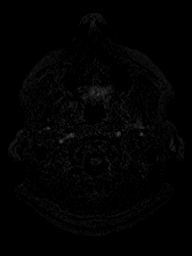
[im 24/144]
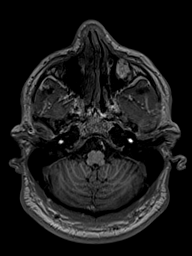
[im 48/144]
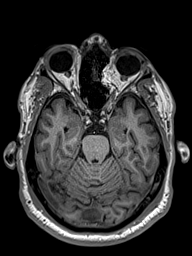
[im 72/144]
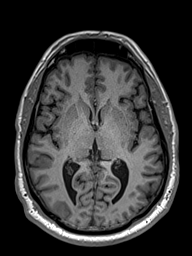
[im 96/144]
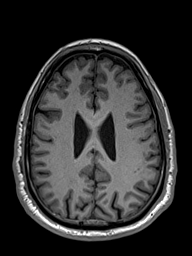
[im 120/144]
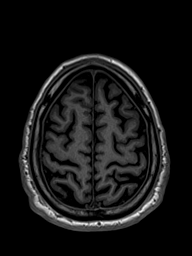
[im 144/144]
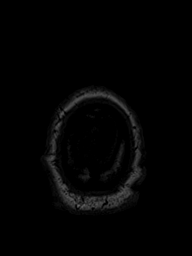

[Series 16: FLAIR · sagittal · 4.0mm · 0.72mm/px · 1 of 26 slices shown (2 of 2)]
[im 1/26]
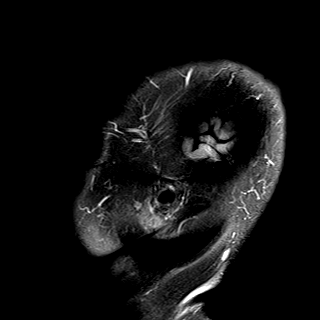

[Series 17: T2 post-contrast · coronal · 4.0mm · 0.36mm/px · 2 of 34 slices shown]
[im 1/34]
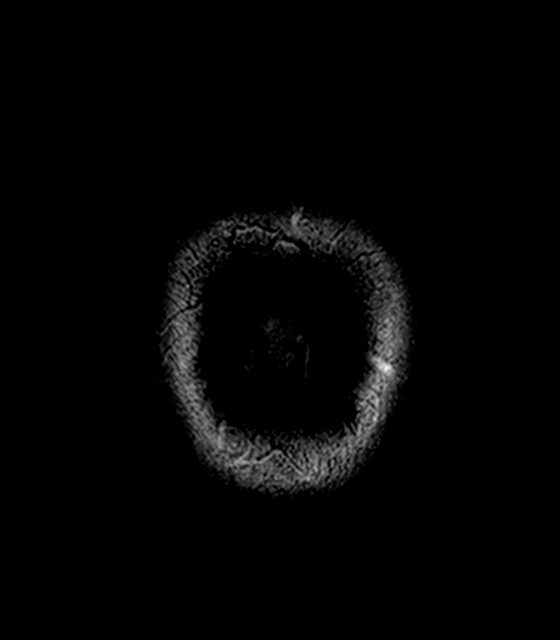
[im 34/34]
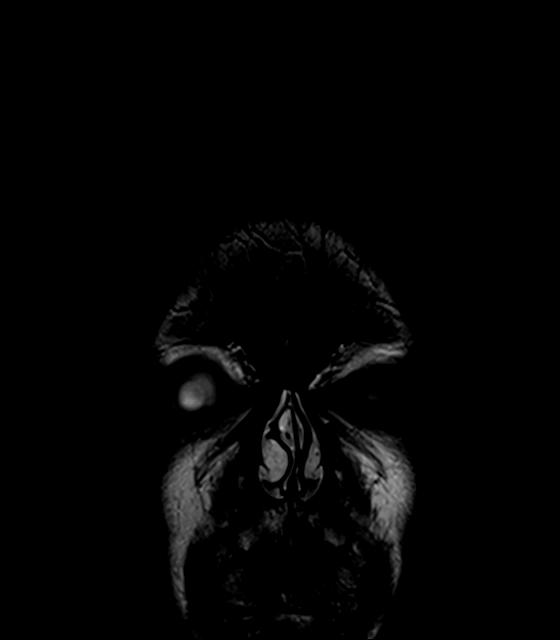

[Series 18: T1 · axial · 1.0mm · 0.94mm/px · z∈[-67,+74]mm · 7 of 144 slices shown (3 of 3)]
[im 1/144]
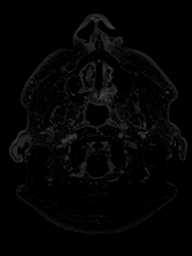
[im 24/144]
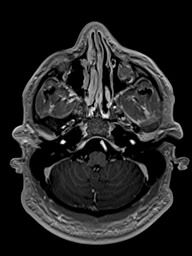
[im 48/144]
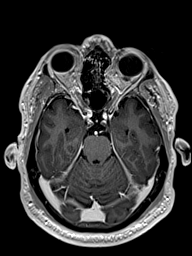
[im 72/144]
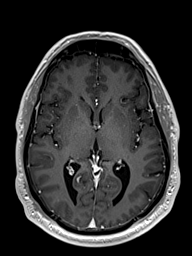
[im 96/144]
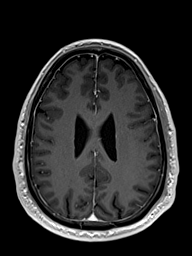
[im 120/144]
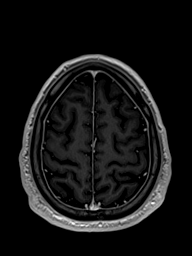
[im 144/144]
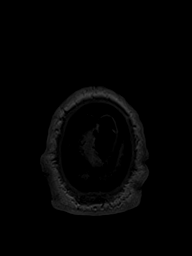

[Series 19: T1 post-contrast · coronal · 4.0mm · 0.72mm/px · 2 of 34 slices shown]
[im 1/34]
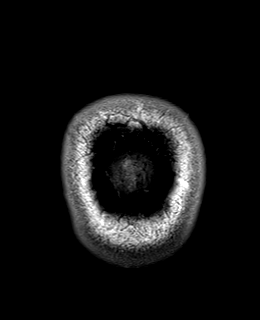
[im 34/34]
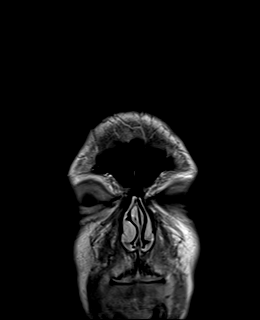

[48 of 48 positions shown; findings below may reference images not displayed]

FINDINGS: Brain: No acute infarction, hemorrhage, hydrocephalus, extra-axial
collection or mass lesion. No abnormal enhancement. Mild scattered
T2/FLAIR hyperintensities within the white matter.

Vascular: Major arterial flow voids are maintained at the skull
base.

Skull and upper cervical spine: Normal marrow signal.

Sinuses/Orbits: Remote left medial orbital wall fracture. Otherwise
unremarkable orbits. Mild paranasal sinus mucosal thickening.

Other: No mastoid effusions.
IMPRESSION: 1. No evidence of acute intracranial abnormality.
2. Mild scattered small T2/FLAIR hyperintensities the white matter,
which are nonspecific. These could potentially be secondary to
chronic microvascular ischemic disease, chronic migraines, or
possibly chronic demyelination given spine findings although these
lesions do not have a specific/classic appearance of demyelination.
No abnormal enhancement to suggest active demyelination.

## 2021-10-12 MED ORDER — GADOBENATE DIMEGLUMINE 529 MG/ML IV SOLN
20.0000 mL | Freq: Once | INTRAVENOUS | Status: AC | PRN
  Administered 2021-10-12: 20 mL via INTRAVENOUS

## 2021-10-14 ENCOUNTER — Telehealth: Payer: Self-pay | Admitting: Neurology

## 2021-10-14 DIAGNOSIS — R9089 Other abnormal findings on diagnostic imaging of central nervous system: Secondary | ICD-10-CM

## 2021-10-14 DIAGNOSIS — G360 Neuromyelitis optica [Devic]: Secondary | ICD-10-CM

## 2021-10-14 NOTE — Telephone Encounter (Signed)
I called patient and let him know MRI thoracic spine shows scatter white matter changes of short segments involving the cord from the lower cervical segments to T4-5, none which enhance.  MRI brain shows very mild white matter changes, no classic lesions seen with MS.   Given the relative sparing of the brain, I will check NMO antibody and proceed with CSF testing.  Clinical suspicion remains high for inflammatory-mediate process.    Evan Jones K. Allena Katz, DO

## 2021-10-15 ENCOUNTER — Other Ambulatory Visit (INDEPENDENT_AMBULATORY_CARE_PROVIDER_SITE_OTHER): Payer: BC Managed Care – PPO

## 2021-10-15 ENCOUNTER — Other Ambulatory Visit: Payer: Self-pay

## 2021-10-15 DIAGNOSIS — R9089 Other abnormal findings on diagnostic imaging of central nervous system: Secondary | ICD-10-CM

## 2021-10-15 DIAGNOSIS — G360 Neuromyelitis optica [Devic]: Secondary | ICD-10-CM

## 2021-10-17 LAB — NEUROMYELITIS OPTICA AUTOAB, IGG: NMO IgG Autoantibodies: <1.5 U/mL (ref 0.0–3.0)

## 2021-10-20 ENCOUNTER — Ambulatory Visit
Admission: RE | Admit: 2021-10-20 | Discharge: 2021-10-20 | Disposition: A | Discharge status: Home / Self Care | Payer: BC Managed Care – PPO | Source: Ambulatory Visit | Attending: Neurology | Admitting: Neurology

## 2021-10-20 ENCOUNTER — Other Ambulatory Visit (HOSPITAL_COMMUNITY)
Admission: RE | Admit: 2021-10-20 | Discharge: 2021-10-20 | Disposition: A | Discharge status: Home / Self Care | Payer: BC Managed Care – PPO | Source: Ambulatory Visit | Attending: Neurology | Admitting: Neurology

## 2021-10-20 ENCOUNTER — Other Ambulatory Visit: Payer: Self-pay

## 2021-10-20 DIAGNOSIS — G360 Neuromyelitis optica [Devic]: Secondary | ICD-10-CM

## 2021-10-20 DIAGNOSIS — R9089 Other abnormal findings on diagnostic imaging of central nervous system: Secondary | ICD-10-CM

## 2021-10-20 IMAGING — XA DG SPINAL PUNCT LUMBAR DIAG WITH FL CT GUIDANCE
2 series · 2 of 2 positions shown · non-contrast
Comparison: none

CLINICAL DATA: Bilateral hand and left-sided body numbness after
fall from truck

[Series 1: ortho standard · 1 of 1 slices shown (1 of 2)]
[im 1/1]
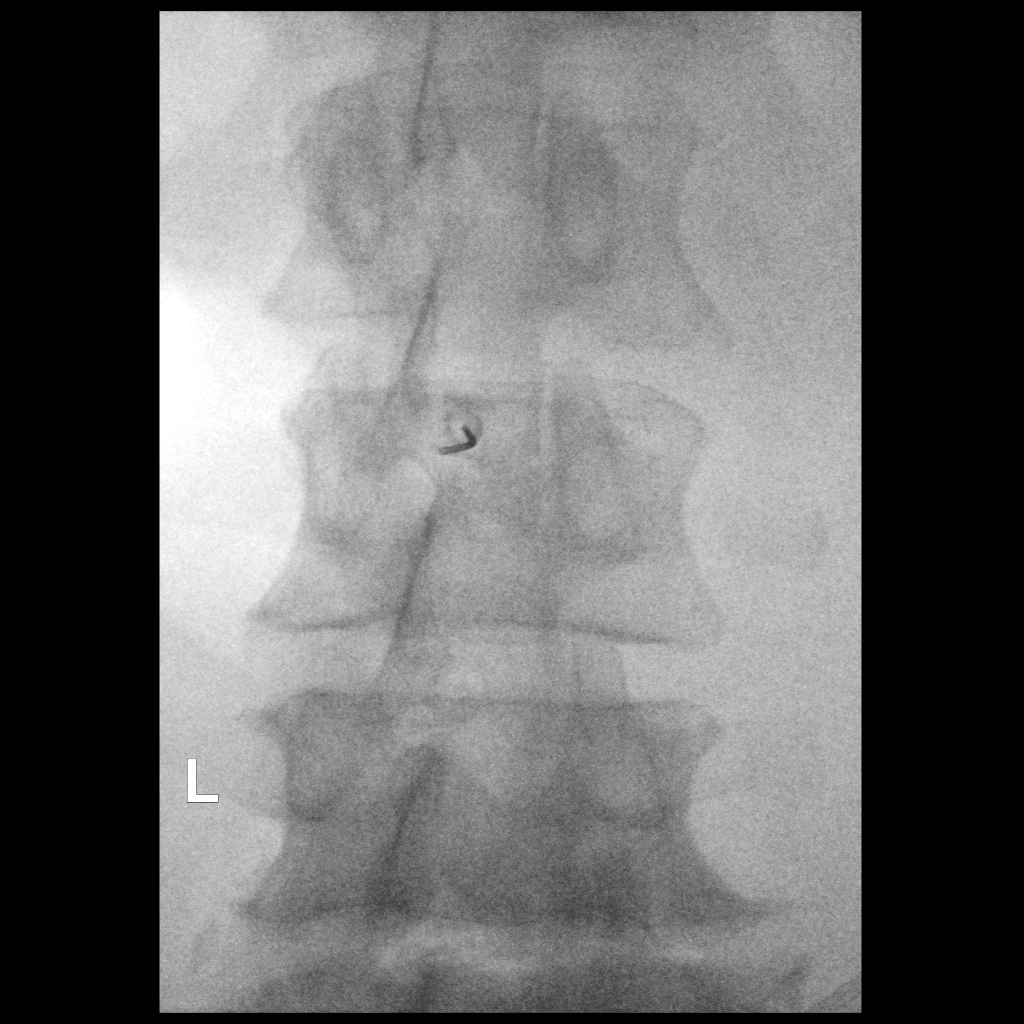

[Series 2: ortho standard · 1 of 1 slices shown (2 of 2)]
[im 1/1]
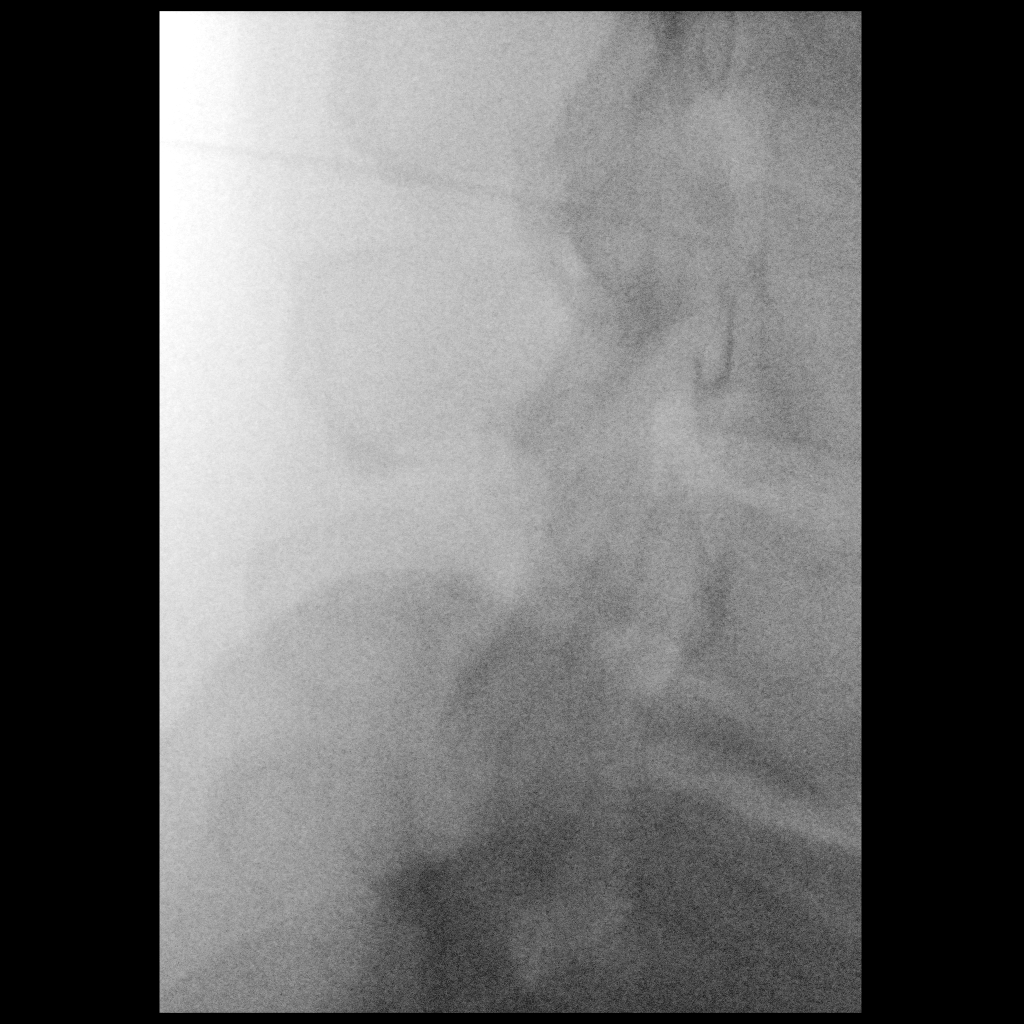

[2 of 2 positions shown; findings below may reference images not displayed]

EXAM:
DIAGNOSTIC LUMBAR PUNCTURE UNDER FLUOROSCOPIC GUIDANCE

FLUOROSCOPY TIME:  Radiation Exposure Index (as provided by the
fluoroscopic device): 2.0 mGy Kerma

PROCEDURE:
Informed consent was obtained from the patient prior to the
procedure, including potential complications of headache, allergy,
and pain. With the patient prone, the lower back was prepped with
Betadine. 1% Lidocaine was used for local anesthesia. Lumbar
puncture was performed at the L2-3 level from a right parasagittal
approach using a 20 gauge needle. Initially there is some blood
tinged fluid which became clear and colorless over the course of the
collection. Low opening pressure, with very slow fluid returned,
required turn patient left lateral decubitus to obtain the required
volume. A total of 17.5 mL CSF were obtained for laboratory
studies. The patient tolerated the procedure well and there were no
apparent complications.
IMPRESSION: 1. Technically successful lumbar puncture under fluoroscopy

## 2021-10-20 NOTE — Discharge Instructions (Signed)

## 2021-10-20 NOTE — Progress Notes (Signed)
Serum collected (1 tube) from pts right AC to be sent with LP blood work. Pt tolerated well. 1 successful attempt. Gauze and tape applied after.

## 2021-10-21 LAB — CYTOLOGY - NON PAP

## 2021-10-29 LAB — CSF CELL COUNT WITH DIFFERENTIAL
RBC Count, CSF: 930 cells/uL — ABNORMAL HIGH
WBC, CSF: 1 cells/uL (ref 0–5)

## 2021-10-29 LAB — OLIGOCLONAL BANDS, CSF + SERM

## 2021-10-29 LAB — MYELIN BASIC PROTEIN, CSF: Myelin Basic Protein: <2 mcg/L (ref <=4.0)

## 2021-10-29 LAB — CNS IGG SYNTHESIS RATE, CSF+BLOOD
Albumin Serum: 4.7 g/dL (ref 3.6–5.1)
Albumin, CSF: 62.2 mg/dL — ABNORMAL HIGH (ref 8.0–42.0)
CNS-IgG Synthesis Rate: 4.8 mg/24 h — ABNORMAL HIGH (ref <=3.3)
IgG (Immunoglobin G), Serum: 1230 mg/dL (ref 600–1640)
IgG Total CSF: 9 mg/dL — ABNORMAL HIGH (ref 0.8–7.7)
IgG-Index: 0.55 (ref <0.70)

## 2021-10-29 LAB — ANGIOTENSIN CONVERTING ENZYME, CSF: ANGIOTENSIN CONVERTING ENZYME ( ACE) CSF: 4 U/L (ref <=15)

## 2021-10-29 LAB — GLUCOSE, CSF: Glucose, CSF: 71 mg/dL (ref 40–80)

## 2021-10-29 LAB — LEUKEMIA/LYMPHOMA EVALUATION PANEL

## 2021-10-29 LAB — PROTEIN, CSF: Total Protein, CSF: 103 mg/dL — ABNORMAL HIGH (ref 15–45)

## 2021-10-30 ENCOUNTER — Encounter: Payer: Self-pay | Admitting: Neurology

## 2021-10-30 ENCOUNTER — Ambulatory Visit (INDEPENDENT_AMBULATORY_CARE_PROVIDER_SITE_OTHER): Payer: BC Managed Care – PPO | Admitting: Neurology

## 2021-10-30 ENCOUNTER — Other Ambulatory Visit: Payer: Self-pay

## 2021-10-30 ENCOUNTER — Other Ambulatory Visit (INDEPENDENT_AMBULATORY_CARE_PROVIDER_SITE_OTHER): Payer: BC Managed Care – PPO

## 2021-10-30 VITALS — BP 142/94 | HR 89 | Ht 73.0 in | Wt 263.2 lb

## 2021-10-30 DIAGNOSIS — R29898 Other symptoms and signs involving the musculoskeletal system: Secondary | ICD-10-CM | POA: Diagnosis not present

## 2021-10-30 DIAGNOSIS — R269 Unspecified abnormalities of gait and mobility: Secondary | ICD-10-CM

## 2021-10-30 DIAGNOSIS — G0491 Myelitis, unspecified: Secondary | ICD-10-CM

## 2021-10-30 NOTE — Patient Instructions (Addendum)
Check a few additional labs ?Plan to proceed with steroid infusion  ?Referral to neuroimmunologist for second opinion.  Please take your images on CD to your appointment.  ? ?

## 2021-10-30 NOTE — Progress Notes (Signed)
? ? ?Follow-up Visit ? ? ?Date: 10/30/21 ? ? ?Evan Jones ?MRN: [id number] ?DOB: 10-Feb-1966 ? ? ?Interim History: ?Evan Jones is a 56 y.o. right-handed African American male with diabetes mellitus and hypertension returning to the clinic for follow-up of cervical and thoracic myelitis.  The patient was accompanied to the clinic by self.  ? ?History of present illness: ?He was initially seen by me on 09/26/2021 after his MRI cervical spine showed T2 cord hyperintensity at C5-6 with mild enhancement.  His symptoms date back to August 23rd, when he slipped off the truck and landed on the concrete which caused knee pain. He did not recall injuring his neck. On September 4th, he was laying in the bed and began having tingling in his fingers and toes, but spared this thumbs. He went to [facility] ER  where CT head without contrast was normal.  Since Septmber, he began having numbness/tingling involving the entire left arm and leg.  He has difficulty with walking because of leg weakness, especially getting up from a chair or climbing steps. He has fallen three times. No cramps.  He had MRI cervical spine on 12/3 which showed T2 hyperintensity involving the cord at C5-6, along with moderate disc protrusion at this level. Repeat contrasted MRI on 12/30 shows T2 signal abnormality at C5-6 with small enhancement suggesting acute inflammatory process. ? ?UPDATE 10/30/2021:  He is here to discuss the results of his imaging and CSF testing.  Since his last visit, he underwent MRI brain and thoracic spine.  Imaging of the brain was essentially unremarkable, no significant white matter changes.  MRI of the thoracic spine shows short segment multifocal T2 hyperintense lesions involving lower cervical spine down to T4-5.  There was no enhancement.  NMO antibody was negative.  LP shows elevated protein, normal cell count, ACE, and IgG index 0.5.  there was 4 oligoclonal bands in both CSF and serum.   ? ?Clinically, patient has noticed  worsening numbness and weakness in the hands, sometimes dropping objects.  His painful tingling has been improved with gabapentin 659m in the morning and 904mat bedtime.  His proximal legs continue to feel weak.  He walks with a cane.  ? ?Medications:  ?Current Outpatient Medications on File Prior to Visit  ?Medication Sig Dispense Refill  ? cyclobenzaprine (FLEXERIL) 10 MG tablet Take 10 mg by mouth 3 (three) times daily as needed for muscle spasms.    ? gabapentin (NEURONTIN) 300 MG capsule Take 30018mn the morning and 1200m55m bedtime 450 capsule 1  ? metFORMIN (GLUCOPHAGE) 1000 MG tablet Take 1,000 mg by mouth 2 (two) times daily.    ? naproxen (NAPROSYN) 250 MG tablet Take 2 tablets (500 mg total) by mouth 2 (two) times daily with a meal. 28 tablet 0  ? naproxen sodium (ALEVE) 220 MG tablet Take 440 mg by mouth daily.    ? nortriptyline (PAMELOR) 25 MG capsule Take 25 mg by mouth at bedtime as needed for sleep.    ? tadalafil (CIALIS) 5 MG tablet Take 5 mg by mouth daily.    ? telmisartan (MICARDIS) 40 MG tablet Take 40 mg by mouth daily.    ? ?No current facility-administered medications on file prior to visit.  ? ? ?Allergies: No Known Allergies ? ?Vital Signs:  ?BP (!) 164/110 Comment: pt stated in pain  Pulse 89   Ht 6' 1" (1.854 m)   Wt 263 lb 3.2 oz (119.4 kg)   SpO2 97%  BMI 34.73 kg/m?  ?  ?Neurological Exam: ?MENTAL STATUS including orientation to time, place, person, recent and remote memory, attention span and concentration, language, and fund of knowledge is normal.  Speech is not dysarthric. ? ?CRANIAL NERVES:    Pupils equal round and reactive to light.  Normal conjugate, extra-ocular eye movements in all directions of gaze.  No ptosis.  Face is symmetric. ? ?Motor:  No atrophy, tremor, or fasciculations. ? ?Upper Extremity:  Right   Left  ?Deltoid  5/5    5/5   ?Biceps  5/5    5/5   ?Triceps  5/5    5/5   ?Infraspinatus 5/5   5/5  ?Medial pectoralis 5/5   5/5  ?Wrist extensors  5/5     5/5   ?Wrist flexors  5/5    5/5   ?Finger extensors  4/5    4/5   ?Finger flexors  4/5    4/5   ?Dorsal interossei  4/5    4/5   ?Abductor pollicis  4/5    4/5   ?Tone (Ashworth scale)  0   0  ?  ?Lower Extremity:  Right   Left  ?Hip flexors  4+/5    4/5   ?Hip extensors  5/5    5/5   ?Knee flexors  5/5    5/5   ?Knee extensors  5/5    5/5   ?Dorsiflexors  5/5    5/5   ?Plantarflexors  5/5    5/5   ?Toe extensors  5/5    5/5   ?Toe flexors  5/5    5/5   ?Tone (Ashworth scale)  0   0  ?  ?MSRs:     ?Right         Left                  ?brachioradialis 2+   2+  ?biceps 2+   2+  ?triceps 2+   2+  ?patellar 3+   3+  ?ankle jerk 2+   2+  ?Hoffman yes   yes  ? ?SENSORY:  Temperature and vibration is reduced on the left arm and leg, compared to the right. ?  ?COORDINATION/GAIT: Normal finger-to- nose-finger. Finger tapping is slowed bilaterally. Unable to rise from a chair without using arms. Gait is wide-based, assisted with cane, appears stable. ?  ? ?Data: ?MRI brain wwo contrast 10/13/2021: ?1. No evidence of acute intracranial abnormality. ?2. Mild scattered small T2/FLAIR hyperintensities the white matter, which are nonspecific. These could potentially be secondary to chronic microvascular ischemic disease, chronic migraines, or possibly chronic demyelination given spine findings although these lesions do not have a specific/classic appearance of demyelination. ?No abnormal enhancement to suggest active demyelination. ? ?MRI thoracic spine ww contrast 10/13/2021: ?1. Multifocal T2 hyperintense cord lesions, detailed above and compatible with reported history of demyelinating disease. No evidence of abnormal enhancement in the thoracic cord. ?2. Multilevel degenerative change, detailed above. ? ?CSF testing 10/20/2021:  R 930*  W1  G71  P103*  IgG index 0.5, ACE 4, cytology neg, MBP < 2.0, OCB 4 bands in serum and CSF ? ?Labs 10/15/2021:  NMO antibody negative ?Labs 09/26/2021:   ESR 2, CRP 1.7,  vitamin D 38, copper 92,  vitamin B12 368, folate 14.2, vitamin E 7.8 ? 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 There is slight enhancement in the cervical spine, none in the thoracic region.  MRI brain  shows very little white matter changes makes multiple sclerosis less likely.  NMO antibody has been negative.  CSF testing shows elevated protein, oligoclonal bands in both CSF and serum, and otherwise normal IgG index, ACE, flow cytology, and myelin basic protein.  No signs of infection.  Imaging and results were reviewed with patient. ? ?Given high clinical suspicion for an immune-mediated process, I will give a trial of high dose steroids - Solumedrol 1g x 3 days.  He is diabetic and patient was informed to monitor blood sugars closely during this time, as it will elevate his blood glucose.  I will also notify his PCP of this.  ?I will check a few additional labs - ANA, SSA/B, anti-MOG ?Continue gabapentin 659m in the morning and 9059mat bedtime ?Start PT and OT ?Finally, I will seek the opinion of neuroimmunologist in his care ? ?Total time spent reviewing records, interview, history/exam, documentation, and coordination of care on day of encounter:  45 min ? ? ?Thank you for allowing me to participate in patient's care.  If I can answer any additional questions, I would be pleased to do so.   ? ?Sincerely, ? ? ? ?Jezlyn Westerfield K. PaPosey ProntoDO ? ? ?

## 2021-10-31 ENCOUNTER — Telehealth: Payer: Self-pay | Admitting: Pharmacy Technician

## 2021-10-31 ENCOUNTER — Ambulatory Visit: Payer: BC Managed Care – PPO | Admitting: Neurology

## 2021-10-31 LAB — ANTI-MOG, SERUM: MOG Antibody, Cell-based IFA: NEGATIVE

## 2021-10-31 LAB — SJOGREN'S SYNDROME ANTIBODS(SSA + SSB)
SSA (Ro) (ENA) Antibody, IgG: <1 AI
SSB (La) (ENA) Antibody, IgG: <1 AI

## 2021-10-31 LAB — ANA: Anti Nuclear Antibody (ANA): NEGATIVE

## 2021-10-31 NOTE — Telephone Encounter (Signed)
Dr. Allena Katz, ?Fyi note ?Auth Submission: no auth needed ?Payer: bcbs ?Medication & CPT/J Code(s) submitted: solu-medrol [id number] ?Route of submission (phone, fax, portal): phone ?Auth type: Buy/Bill ?Units/visits requested: 3 ?Reference number: [id number] 10/31/21@ 2:32 ?Approval from: 10/31/21 to 12/01/21  ? ?Patient will be scheduled as soon as possible. ?

## 2021-11-02 ENCOUNTER — Other Ambulatory Visit: Payer: Self-pay | Admitting: Pharmacy Technician

## 2021-11-03 ENCOUNTER — Other Ambulatory Visit: Payer: Self-pay

## 2021-11-03 ENCOUNTER — Telehealth: Payer: Self-pay

## 2021-11-03 DIAGNOSIS — R29898 Other symptoms and signs involving the musculoskeletal system: Secondary | ICD-10-CM

## 2021-11-03 DIAGNOSIS — R269 Unspecified abnormalities of gait and mobility: Secondary | ICD-10-CM

## 2021-11-03 NOTE — Telephone Encounter (Signed)
Called patient and left a message for a call back.  ? ?Need to inform patient that Dr. Allena Katz has placed out-patient PT and OT referrals for him. She forgot to mention this to patient, but we need to start to work on his strength and balance.  ?

## 2021-11-04 ENCOUNTER — Other Ambulatory Visit: Payer: Self-pay

## 2021-11-04 DIAGNOSIS — G0491 Myelitis, unspecified: Secondary | ICD-10-CM

## 2021-11-05 ENCOUNTER — Ambulatory Visit: Payer: BC Managed Care – PPO

## 2021-11-06 ENCOUNTER — Ambulatory Visit: Payer: BC Managed Care – PPO

## 2021-11-07 ENCOUNTER — Ambulatory Visit: Payer: BC Managed Care – PPO

## 2021-11-14 ENCOUNTER — Ambulatory Visit: Payer: BC Managed Care – PPO | Attending: Neurology | Admitting: Occupational Therapy

## 2021-11-14 ENCOUNTER — Ambulatory Visit: Payer: BC Managed Care – PPO | Admitting: Physical Therapy

## 2021-11-18 ENCOUNTER — Encounter: Payer: Self-pay | Admitting: Occupational Therapy

## 2021-11-18 ENCOUNTER — Ambulatory Visit: Payer: Self-pay

## 2021-11-19 ENCOUNTER — Ambulatory Visit: Payer: BC Managed Care – PPO | Admitting: Physical Therapy

## 2021-11-19 ENCOUNTER — Ambulatory Visit: Payer: BC Managed Care – PPO | Admitting: Occupational Therapy

## 2021-12-25 ENCOUNTER — Telehealth: Payer: Self-pay | Admitting: Neurology

## 2021-12-25 MED ORDER — GABAPENTIN 300 MG PO CAPS: ORAL_CAPSULE | ORAL | 1 refills | Status: AC

## 2021-12-25 NOTE — Telephone Encounter (Signed)
Patient needs a refill of his gabapentin 300mg  ?

## 2021-12-25 NOTE — Telephone Encounter (Signed)
Gabapentin has been sent to the pharmacy. Called patient and left a message that his refill has been sent.  ?

## 2022-05-01 ENCOUNTER — Other Ambulatory Visit: Payer: Self-pay | Admitting: Nurse Practitioner

## 2022-05-01 DIAGNOSIS — M25562 Pain in left knee: Secondary | ICD-10-CM

## 2022-05-15 ENCOUNTER — Ambulatory Visit
Admission: RE | Admit: 2022-05-15 | Discharge: 2022-05-15 | Disposition: A | Discharge status: Home / Self Care | Payer: Self-pay | Source: Ambulatory Visit | Attending: Nurse Practitioner | Admitting: Nurse Practitioner

## 2022-05-15 DIAGNOSIS — M25562 Pain in left knee: Secondary | ICD-10-CM

## 2022-05-15 MED ORDER — GADOBENATE DIMEGLUMINE 529 MG/ML IV SOLN
20.0000 mL | Freq: Once | INTRAVENOUS | Status: AC | PRN
  Administered 2022-05-15: 20 mL via INTRAVENOUS

## 2022-10-01 DIAGNOSIS — Z419 Encounter for procedure for purposes other than remedying health state, unspecified: Secondary | ICD-10-CM | POA: Diagnosis not present

## 2022-10-07 DIAGNOSIS — R202 Paresthesia of skin: Secondary | ICD-10-CM | POA: Diagnosis not present

## 2022-10-07 DIAGNOSIS — R200 Anesthesia of skin: Secondary | ICD-10-CM | POA: Diagnosis not present

## 2022-10-07 DIAGNOSIS — M25562 Pain in left knee: Secondary | ICD-10-CM | POA: Diagnosis not present

## 2022-10-07 DIAGNOSIS — Z1330 Encounter for screening examination for mental health and behavioral disorders, unspecified: Secondary | ICD-10-CM | POA: Diagnosis not present

## 2022-10-15 DIAGNOSIS — M94262 Chondromalacia, left knee: Secondary | ICD-10-CM | POA: Diagnosis not present

## 2022-10-15 DIAGNOSIS — M9420 Chondromalacia, unspecified site: Secondary | ICD-10-CM | POA: Diagnosis not present

## 2022-10-15 DIAGNOSIS — R52 Pain, unspecified: Secondary | ICD-10-CM | POA: Diagnosis not present

## 2022-10-19 DIAGNOSIS — M1712 Unilateral primary osteoarthritis, left knee: Secondary | ICD-10-CM | POA: Diagnosis not present

## 2022-10-19 DIAGNOSIS — G8929 Other chronic pain: Secondary | ICD-10-CM | POA: Diagnosis not present

## 2022-10-19 DIAGNOSIS — M25562 Pain in left knee: Secondary | ICD-10-CM | POA: Diagnosis not present

## 2022-10-30 DIAGNOSIS — Z419 Encounter for procedure for purposes other than remedying health state, unspecified: Secondary | ICD-10-CM | POA: Diagnosis not present

## 2022-11-16 DIAGNOSIS — R200 Anesthesia of skin: Secondary | ICD-10-CM | POA: Diagnosis not present

## 2022-11-16 DIAGNOSIS — R202 Paresthesia of skin: Secondary | ICD-10-CM | POA: Diagnosis not present

## 2022-11-16 DIAGNOSIS — I10 Essential (primary) hypertension: Secondary | ICD-10-CM | POA: Diagnosis not present

## 2022-11-16 DIAGNOSIS — E1169 Type 2 diabetes mellitus with other specified complication: Secondary | ICD-10-CM | POA: Diagnosis not present

## 2022-11-16 DIAGNOSIS — M25562 Pain in left knee: Secondary | ICD-10-CM | POA: Diagnosis not present

## 2022-11-30 DIAGNOSIS — Z419 Encounter for procedure for purposes other than remedying health state, unspecified: Secondary | ICD-10-CM | POA: Diagnosis not present

## 2022-11-30 DIAGNOSIS — M461 Sacroiliitis, not elsewhere classified: Secondary | ICD-10-CM | POA: Diagnosis not present

## 2022-11-30 DIAGNOSIS — M9420 Chondromalacia, unspecified site: Secondary | ICD-10-CM | POA: Diagnosis not present

## 2022-11-30 DIAGNOSIS — M47816 Spondylosis without myelopathy or radiculopathy, lumbar region: Secondary | ICD-10-CM | POA: Diagnosis not present

## 2022-11-30 DIAGNOSIS — R52 Pain, unspecified: Secondary | ICD-10-CM | POA: Diagnosis not present

## 2022-12-07 DIAGNOSIS — M461 Sacroiliitis, not elsewhere classified: Secondary | ICD-10-CM | POA: Diagnosis not present

## 2022-12-07 DIAGNOSIS — R52 Pain, unspecified: Secondary | ICD-10-CM | POA: Diagnosis not present

## 2022-12-07 DIAGNOSIS — M47816 Spondylosis without myelopathy or radiculopathy, lumbar region: Secondary | ICD-10-CM | POA: Diagnosis not present

## 2022-12-07 DIAGNOSIS — M9420 Chondromalacia, unspecified site: Secondary | ICD-10-CM | POA: Diagnosis not present

## 2022-12-08 DIAGNOSIS — M47816 Spondylosis without myelopathy or radiculopathy, lumbar region: Secondary | ICD-10-CM | POA: Diagnosis not present

## 2022-12-08 DIAGNOSIS — M25562 Pain in left knee: Secondary | ICD-10-CM | POA: Diagnosis not present

## 2022-12-08 DIAGNOSIS — M461 Sacroiliitis, not elsewhere classified: Secondary | ICD-10-CM | POA: Diagnosis not present

## 2022-12-08 DIAGNOSIS — M545 Low back pain, unspecified: Secondary | ICD-10-CM | POA: Diagnosis not present

## 2022-12-08 DIAGNOSIS — M25561 Pain in right knee: Secondary | ICD-10-CM | POA: Diagnosis not present

## 2022-12-08 DIAGNOSIS — M48062 Spinal stenosis, lumbar region with neurogenic claudication: Secondary | ICD-10-CM | POA: Diagnosis not present

## 2022-12-08 DIAGNOSIS — G8929 Other chronic pain: Secondary | ICD-10-CM | POA: Diagnosis not present

## 2022-12-14 DIAGNOSIS — M9420 Chondromalacia, unspecified site: Secondary | ICD-10-CM | POA: Diagnosis not present

## 2022-12-14 DIAGNOSIS — M47816 Spondylosis without myelopathy or radiculopathy, lumbar region: Secondary | ICD-10-CM | POA: Diagnosis not present

## 2022-12-14 DIAGNOSIS — M461 Sacroiliitis, not elsewhere classified: Secondary | ICD-10-CM | POA: Diagnosis not present

## 2022-12-14 DIAGNOSIS — R52 Pain, unspecified: Secondary | ICD-10-CM | POA: Diagnosis not present

## 2022-12-16 DIAGNOSIS — M47816 Spondylosis without myelopathy or radiculopathy, lumbar region: Secondary | ICD-10-CM | POA: Diagnosis not present

## 2022-12-16 DIAGNOSIS — M461 Sacroiliitis, not elsewhere classified: Secondary | ICD-10-CM | POA: Diagnosis not present

## 2022-12-16 DIAGNOSIS — M9420 Chondromalacia, unspecified site: Secondary | ICD-10-CM | POA: Diagnosis not present

## 2022-12-16 DIAGNOSIS — R52 Pain, unspecified: Secondary | ICD-10-CM | POA: Diagnosis not present

## 2022-12-29 DIAGNOSIS — M47816 Spondylosis without myelopathy or radiculopathy, lumbar region: Secondary | ICD-10-CM | POA: Diagnosis not present

## 2022-12-29 DIAGNOSIS — M461 Sacroiliitis, not elsewhere classified: Secondary | ICD-10-CM | POA: Diagnosis not present

## 2022-12-29 DIAGNOSIS — M9420 Chondromalacia, unspecified site: Secondary | ICD-10-CM | POA: Diagnosis not present

## 2022-12-29 DIAGNOSIS — R52 Pain, unspecified: Secondary | ICD-10-CM | POA: Diagnosis not present

## 2022-12-31 DIAGNOSIS — M47816 Spondylosis without myelopathy or radiculopathy, lumbar region: Secondary | ICD-10-CM | POA: Diagnosis not present

## 2022-12-31 DIAGNOSIS — M9420 Chondromalacia, unspecified site: Secondary | ICD-10-CM | POA: Diagnosis not present

## 2022-12-31 DIAGNOSIS — M461 Sacroiliitis, not elsewhere classified: Secondary | ICD-10-CM | POA: Diagnosis not present

## 2022-12-31 DIAGNOSIS — R52 Pain, unspecified: Secondary | ICD-10-CM | POA: Diagnosis not present

## 2023-01-05 DIAGNOSIS — M47816 Spondylosis without myelopathy or radiculopathy, lumbar region: Secondary | ICD-10-CM | POA: Diagnosis not present

## 2023-01-05 DIAGNOSIS — M461 Sacroiliitis, not elsewhere classified: Secondary | ICD-10-CM | POA: Diagnosis not present

## 2023-01-15 DIAGNOSIS — Z77120 Contact with and (suspected) exposure to mold (toxic): Secondary | ICD-10-CM | POA: Diagnosis not present

## 2023-01-18 DIAGNOSIS — G629 Polyneuropathy, unspecified: Secondary | ICD-10-CM | POA: Diagnosis not present

## 2023-01-18 DIAGNOSIS — R200 Anesthesia of skin: Secondary | ICD-10-CM | POA: Diagnosis not present

## 2023-01-22 DIAGNOSIS — M47816 Spondylosis without myelopathy or radiculopathy, lumbar region: Secondary | ICD-10-CM | POA: Insufficient documentation

## 2023-02-08 DIAGNOSIS — M48061 Spinal stenosis, lumbar region without neurogenic claudication: Secondary | ICD-10-CM | POA: Diagnosis not present

## 2023-02-08 DIAGNOSIS — M94262 Chondromalacia, left knee: Secondary | ICD-10-CM | POA: Diagnosis not present

## 2023-02-25 ENCOUNTER — Institutional Professional Consult (permissible substitution): Payer: Medicaid Other | Admitting: Pulmonary Disease

## 2023-02-25 ENCOUNTER — Encounter: Payer: Self-pay | Admitting: Pulmonary Disease

## 2023-03-08 DIAGNOSIS — M5030 Other cervical disc degeneration, unspecified cervical region: Secondary | ICD-10-CM | POA: Insufficient documentation

## 2023-03-08 DIAGNOSIS — G9520 Unspecified cord compression: Secondary | ICD-10-CM | POA: Insufficient documentation

## 2023-03-08 DIAGNOSIS — M47812 Spondylosis without myelopathy or radiculopathy, cervical region: Secondary | ICD-10-CM | POA: Insufficient documentation

## 2023-06-03 DIAGNOSIS — Z79891 Long term (current) use of opiate analgesic: Secondary | ICD-10-CM | POA: Insufficient documentation

## 2023-06-03 DIAGNOSIS — M48062 Spinal stenosis, lumbar region with neurogenic claudication: Secondary | ICD-10-CM | POA: Insufficient documentation

## 2023-06-03 DIAGNOSIS — G8929 Other chronic pain: Secondary | ICD-10-CM | POA: Insufficient documentation

## 2023-09-28 DIAGNOSIS — M1460 Charcot's joint, unspecified site: Secondary | ICD-10-CM | POA: Insufficient documentation

## 2023-11-29 ENCOUNTER — Encounter: Payer: Self-pay | Admitting: Emergency Medicine

## 2023-11-29 ENCOUNTER — Other Ambulatory Visit: Payer: Self-pay

## 2023-11-29 ENCOUNTER — Emergency Department (HOSPITAL_COMMUNITY)

## 2023-11-29 ENCOUNTER — Encounter (HOSPITAL_COMMUNITY): Payer: Self-pay

## 2023-11-29 ENCOUNTER — Observation Stay (HOSPITAL_COMMUNITY)
Admission: EM | Admit: 2023-11-29 | Discharge: 2023-11-30 | Disposition: A | Discharge status: Home / Self Care | Attending: Surgery | Admitting: Surgery

## 2023-11-29 ENCOUNTER — Ambulatory Visit: Admission: EM | Admit: 2023-11-29 | Discharge: 2023-11-29 | Disposition: A | Discharge status: Home / Self Care

## 2023-11-29 DIAGNOSIS — K611 Rectal abscess: Principal | ICD-10-CM | POA: Insufficient documentation

## 2023-11-29 DIAGNOSIS — K6289 Other specified diseases of anus and rectum: Secondary | ICD-10-CM

## 2023-11-29 DIAGNOSIS — K649 Unspecified hemorrhoids: Secondary | ICD-10-CM | POA: Diagnosis not present

## 2023-11-29 DIAGNOSIS — I10 Essential (primary) hypertension: Secondary | ICD-10-CM | POA: Diagnosis not present

## 2023-11-29 DIAGNOSIS — K610 Anal abscess: Secondary | ICD-10-CM | POA: Diagnosis not present

## 2023-11-29 DIAGNOSIS — F17210 Nicotine dependence, cigarettes, uncomplicated: Secondary | ICD-10-CM | POA: Diagnosis not present

## 2023-11-29 DIAGNOSIS — Z79899 Other long term (current) drug therapy: Secondary | ICD-10-CM | POA: Insufficient documentation

## 2023-11-29 DIAGNOSIS — Z7984 Long term (current) use of oral hypoglycemic drugs: Secondary | ICD-10-CM | POA: Diagnosis not present

## 2023-11-29 DIAGNOSIS — E119 Type 2 diabetes mellitus without complications: Secondary | ICD-10-CM | POA: Diagnosis not present

## 2023-11-29 LAB — CBC WITH DIFFERENTIAL/PLATELET
Abs Immature Granulocytes: 0.05 10*3/uL (ref 0.00–0.07)
Basophils Absolute: 0 10*3/uL (ref 0.0–0.1)
Basophils Relative: 0 %
Eosinophils Absolute: 0.1 10*3/uL (ref 0.0–0.5)
Eosinophils Relative: 0 %
HCT: 47.5 % (ref 39.0–52.0)
Hemoglobin: 14.8 g/dL (ref 13.0–17.0)
Immature Granulocytes: 0 %
Lymphocytes Relative: 20 %
Lymphs Abs: 2.5 10*3/uL (ref 0.7–4.0)
MCH: 23.9 pg — ABNORMAL LOW (ref 26.0–34.0)
MCHC: 31.2 g/dL (ref 30.0–36.0)
MCV: 76.9 fL — ABNORMAL LOW (ref 80.0–100.0)
Monocytes Absolute: 1.3 10*3/uL — ABNORMAL HIGH (ref 0.1–1.0)
Monocytes Relative: 10 %
Neutro Abs: 8.5 10*3/uL — ABNORMAL HIGH (ref 1.7–7.7)
Neutrophils Relative %: 70 %
Platelets: 314 10*3/uL (ref 150–400)
RBC: 6.18 MIL/uL — ABNORMAL HIGH (ref 4.22–5.81)
RDW: 16.4 % — ABNORMAL HIGH (ref 11.5–15.5)
WBC: 12.4 10*3/uL — ABNORMAL HIGH (ref 4.0–10.5)
nRBC: 0 % (ref 0.0–0.2)

## 2023-11-29 LAB — BASIC METABOLIC PANEL WITH GFR
Anion gap: 10 (ref 5–15)
BUN: 11 mg/dL (ref 6–20)
CO2: 25 mmol/L (ref 22–32)
Calcium: 9 mg/dL (ref 8.9–10.3)
Chloride: 102 mmol/L (ref 98–111)
Creatinine, Ser: 0.91 mg/dL (ref 0.61–1.24)
GFR, Estimated: >60 mL/min
Glucose, Bld: 97 mg/dL (ref 70–99)
Potassium: 4.2 mmol/L (ref 3.5–5.1)
Sodium: 137 mmol/L (ref 135–145)

## 2023-11-29 LAB — CBC
HCT: 45.4 % (ref 39.0–52.0)
Hemoglobin: 13.6 g/dL (ref 13.0–17.0)
MCH: 23.8 pg — ABNORMAL LOW (ref 26.0–34.0)
MCHC: 30 g/dL (ref 30.0–36.0)
MCV: 79.4 fL — ABNORMAL LOW (ref 80.0–100.0)
Platelets: 286 10*3/uL (ref 150–400)
RBC: 5.72 MIL/uL (ref 4.22–5.81)
RDW: 15.4 % (ref 11.5–15.5)
WBC: 10.1 10*3/uL (ref 4.0–10.5)
nRBC: 0 % (ref 0.0–0.2)

## 2023-11-29 LAB — CREATININE, SERUM
Creatinine, Ser: 1.12 mg/dL (ref 0.61–1.24)
GFR, Estimated: >60 mL/min (ref >60)

## 2023-11-29 LAB — HEMOGLOBIN A1C
Hgb A1c MFr Bld: 6.2 % — ABNORMAL HIGH (ref 4.8–5.6)
Mean Plasma Glucose: 131.24 mg/dL

## 2023-11-29 LAB — GLUCOSE, CAPILLARY
Glucose-Capillary: 125 mg/dL — ABNORMAL HIGH (ref 70–99)
Glucose-Capillary: 153 mg/dL — ABNORMAL HIGH (ref 70–99)

## 2023-11-29 LAB — POCT FASTING CBG KUC MANUAL ENTRY: POCT Glucose (KUC): 119 mg/dL — AB (ref 70–99)

## 2023-11-29 MED ORDER — PIPERACILLIN-TAZOBACTAM 3.375 G IVPB
3.3750 g | Freq: Three times a day (TID) | INTRAVENOUS | Status: DC
  Administered 2023-11-29 – 2023-11-30 (×2): 3.375 g via INTRAVENOUS
  Filled 2023-11-29 (×2): qty 50

## 2023-11-29 MED ORDER — ONDANSETRON HCL 4 MG/2ML IJ SOLN: 4.0000 mg | Freq: Four times a day (QID) | INTRAMUSCULAR | Status: DC | PRN

## 2023-11-29 MED ORDER — PIPERACILLIN-TAZOBACTAM 3.375 G IVPB 30 MIN
3.3750 g | Freq: Once | INTRAVENOUS | Status: AC
  Administered 2023-11-29: 3.375 g via INTRAVENOUS
  Filled 2023-11-29: qty 50

## 2023-11-29 MED ORDER — GABAPENTIN 400 MG PO CAPS
600.0000 mg | ORAL_CAPSULE | Freq: Every morning | ORAL | Status: DC
  Administered 2023-11-29: 600 mg via ORAL
  Filled 2023-11-29: qty 6

## 2023-11-29 MED ORDER — SODIUM CHLORIDE 0.9 % IV SOLN: INTRAVENOUS | Status: DC

## 2023-11-29 MED ORDER — NORTRIPTYLINE HCL 25 MG PO CAPS: 25.0000 mg | ORAL_CAPSULE | Freq: Every evening | ORAL | Status: DC | PRN

## 2023-11-29 MED ORDER — IRBESARTAN 150 MG PO TABS: 150.0000 mg | ORAL_TABLET | Freq: Every day | ORAL | Status: DC

## 2023-11-29 MED ORDER — IOHEXOL 300 MG/ML  SOLN
100.0000 mL | Freq: Once | INTRAMUSCULAR | Status: AC | PRN
  Administered 2023-11-29: 100 mL via INTRAVENOUS

## 2023-11-29 MED ORDER — DOCUSATE SODIUM 100 MG PO CAPS
100.0000 mg | ORAL_CAPSULE | Freq: Two times a day (BID) | ORAL | Status: DC
  Administered 2023-11-29 (×2): 100 mg via ORAL
  Filled 2023-11-29 (×2): qty 1

## 2023-11-29 MED ORDER — ONDANSETRON 4 MG PO TBDP: 4.0000 mg | ORAL_TABLET | Freq: Four times a day (QID) | ORAL | Status: DC | PRN

## 2023-11-29 MED ORDER — HYDROMORPHONE HCL 1 MG/ML IJ SOLN
1.0000 mg | Freq: Once | INTRAMUSCULAR | Status: AC
  Administered 2023-11-29: 1 mg via INTRAVENOUS
  Filled 2023-11-29: qty 1

## 2023-11-29 MED ORDER — ENOXAPARIN SODIUM 40 MG/0.4ML IJ SOSY
40.0000 mg | PREFILLED_SYRINGE | INTRAMUSCULAR | Status: DC
Start: 2023-11-29 — End: 2023-11-30
  Administered 2023-11-29: 40 mg via SUBCUTANEOUS
  Filled 2023-11-29: qty 0.4

## 2023-11-29 MED ORDER — GABAPENTIN 400 MG PO CAPS
900.0000 mg | ORAL_CAPSULE | Freq: Every day | ORAL | Status: DC
  Administered 2023-11-29: 900 mg via ORAL
  Filled 2023-11-29: qty 3

## 2023-11-29 MED ORDER — HYDROCODONE-ACETAMINOPHEN 5-325 MG PO TABS
1.0000 | ORAL_TABLET | ORAL | Status: DC | PRN
  Administered 2023-11-29 (×2): 2 via ORAL
  Administered 2023-11-30: 1 via ORAL
  Administered 2023-11-30: 2 via ORAL
  Filled 2023-11-29 (×4): qty 2

## 2023-11-29 MED ORDER — INSULIN ASPART 100 UNIT/ML IJ SOLN
0.0000 [IU] | Freq: Three times a day (TID) | INTRAMUSCULAR | Status: DC
  Administered 2023-11-29: 2 [IU] via SUBCUTANEOUS
  Filled 2023-11-29: qty 0.15

## 2023-11-29 MED ORDER — LOSARTAN POTASSIUM 50 MG PO TABS: 50.0000 mg | ORAL_TABLET | Freq: Every day | ORAL | Status: DC

## 2023-11-29 MED ORDER — METHOCARBAMOL 500 MG PO TABS
750.0000 mg | ORAL_TABLET | Freq: Four times a day (QID) | ORAL | Status: DC | PRN
  Administered 2023-11-29: 750 mg via ORAL
  Filled 2023-11-29: qty 2

## 2023-11-29 MED ORDER — MORPHINE SULFATE (PF) 2 MG/ML IV SOLN
2.0000 mg | INTRAVENOUS | Status: DC | PRN
  Administered 2023-11-29 – 2023-11-30 (×2): 2 mg via INTRAVENOUS
  Filled 2023-11-29 (×2): qty 1

## 2023-11-29 MED ORDER — ACETAMINOPHEN 325 MG PO TABS: 650.0000 mg | ORAL_TABLET | Freq: Four times a day (QID) | ORAL | Status: DC | PRN

## 2023-11-29 MED ORDER — METOPROLOL TARTRATE 5 MG/5ML IV SOLN: 5.0000 mg | Freq: Four times a day (QID) | INTRAVENOUS | Status: DC | PRN

## 2023-11-29 MED ORDER — KETOROLAC TROMETHAMINE 15 MG/ML IJ SOLN
15.0000 mg | Freq: Once | INTRAMUSCULAR | Status: AC
  Administered 2023-11-29: 15 mg via INTRAVENOUS
  Filled 2023-11-29: qty 1

## 2023-11-29 MED ORDER — ACETAMINOPHEN 650 MG RE SUPP: 650.0000 mg | Freq: Four times a day (QID) | RECTAL | Status: DC | PRN

## 2023-11-29 MED ORDER — POLYETHYLENE GLYCOL 3350 17 G PO PACK: 17.0000 g | PACK | Freq: Every day | ORAL | Status: DC | PRN

## 2023-11-29 MED ORDER — CYCLOBENZAPRINE HCL 10 MG PO TABS: 10.0000 mg | ORAL_TABLET | Freq: Three times a day (TID) | ORAL | Status: DC | PRN

## 2023-11-29 MED ORDER — INSULIN ASPART 100 UNIT/ML IJ SOLN
0.0000 [IU] | Freq: Every day | INTRAMUSCULAR | Status: DC
Start: 2023-11-29 — End: 2023-11-30
  Filled 2023-11-29: qty 0.05

## 2023-11-29 NOTE — ED Provider Notes (Signed)
 [facility] AT [facility] Provider Note   CSN: [id number] Arrival date & time: 11/29/23  1001     History  Chief Complaint  Patient presents with   Cyst    Evan Jones is a 58 y.o. male, hx of DMII, perirectal abscess, who presents to the ED 2/2 to rectal pain, this been going on for the last 2 days.  He states for the last 2 days, he has had increased pressure, to his rectum, and feels a lot of pain, difficulty putting any pressure on the area.  States that it hurts to touch, or sit down.  Notes the pain is a 10 out of 10.  Denies any fevers or chills.  Has a history of perirectal abscesses, and is suspicious of that.  Denies any problems with bowel movements.    Home Medications Prior to Admission medications   Medication Sig Start Date End Date Taking? Authorizing Provider  Blood Glucose Monitoring Suppl (FREESTYLE FREEDOM LITE) w/Device KIT Use as directed by physician to check blood sugar 06/24/23   [provider]  cyclobenzaprine (FLEXERIL) 10 MG tablet Take 10 mg by mouth 3 (three) times daily as needed for muscle spasms. 04/22/20   [provider]  gabapentin (NEURONTIN) 300 MG capsule Take 600 mg in the morning and 900 mg at bedtime 12/25/21   Nita Sickle K, DO  glipiZIDE (GLUCOTROL) 5 MG tablet Take by mouth. 03/03/23   [provider]  HYDROcodone-acetaminophen (NORCO/VICODIN) 5-325 MG tablet Take 1 tablet by mouth every 8 (eight) hours as needed. 11/02/23   [provider]  losartan (COZAAR) 50 MG tablet Take by mouth. 10/19/23   [provider]  metFORMIN (GLUCOPHAGE) 1000 MG tablet Take 1,000 mg by mouth 2 (two) times daily. 04/21/21   [provider]  methocarbamol (ROBAXIN) 750 MG tablet Take 1 tablet by mouth 4 (four) times daily as needed. 10/29/23   [provider]  naproxen (NAPROSYN) 250 MG tablet Take 2 tablets (500 mg total) by mouth 2 (two) times daily with a meal. 03/31/20    Hall-Potvin, [place], PA-C  naproxen sodium (ALEVE) 220 MG tablet Take 440 mg by mouth daily.    [provider]  nortriptyline (PAMELOR) 25 MG capsule Take 25 mg by mouth at bedtime as needed for sleep. 02/02/21   [provider]  OZEMPIC, 0.25 OR 0.5 MG/DOSE, 2 MG/3ML SOPN Inject into the skin. 11/18/23   [provider]  tadalafil (CIALIS) 5 MG tablet Take 5 mg by mouth daily. 04/10/21   [provider]  telmisartan (MICARDIS) 40 MG tablet Take 40 mg by mouth daily. 04/21/21   [provider]  triamcinolone acetonide (KENALOG-40) 40 MG/ML injection Inject into the articular space. 06/03/23   [provider]      Allergies    Patient has no known allergies.    Review of Systems   Review of Systems  Constitutional:  Negative for fever.  Skin:  Negative for wound.    Physical Exam Updated Vital Signs BP 124/87   Pulse 67   Temp 99.6 F (37.6 C) (Oral)   Resp 16   Ht 6\' 1"  (1.854 m)   Wt 113 kg   SpO2 96%   BMI 32.87 kg/m  Physical Exam Vitals and nursing note reviewed.  Constitutional:      General: He is not in acute distress.    Appearance: He is well-developed.  HENT:     Head: Normocephalic and  atraumatic.  Eyes:     Conjunctiva/sclera: Conjunctivae normal.  Cardiovascular:     Rate and Rhythm: Normal rate and regular rhythm.     Heart sounds: No murmur heard. Pulmonary:     Effort: Pulmonary effort is normal. No respiratory distress.     Breath sounds: Normal breath sounds.  Abdominal:     Palpations: Abdomen is soft.     Tenderness: There is no abdominal tenderness.  Musculoskeletal:        General: No swelling.     Cervical back: Neck supple.     Comments: Chaperone present, large external hemorrhoid, without thrombosis along 6:00 region.  Also has areas of induration, around 7-9 o'clock region, with mild overlying erythema, and edema.  No crepitus, no other skin color changes.  Skin:    General: Skin is warm  and dry.     Capillary Refill: Capillary refill takes less than 2 seconds.  Neurological:     Mental Status: He is alert.  Psychiatric:        Mood and Affect: Mood normal.     ED Results / Procedures / Treatments   Labs (all labs ordered are listed, but only abnormal results are displayed) Labs Reviewed  CBC WITH DIFFERENTIAL/PLATELET - Abnormal; Notable for the following components:      Result Value   WBC 12.4 (*)    RBC 6.18 (*)    MCV 76.9 (*)    MCH 23.9 (*)    RDW 16.4 (*)    Neutro Abs 8.5 (*)    Monocytes Absolute 1.3 (*)    All other components within normal limits  BASIC METABOLIC PANEL WITH GFR    EKG None  Radiology CT PELVIS W CONTRAST Result Date: 11/29/2023 CLINICAL DATA:  Perianal fistula suspected.  Rectal pain for 2 days. EXAM: CT PELVIS WITH CONTRAST TECHNIQUE: Multidetector CT imaging of the pelvis was performed using the standard protocol following the bolus administration of intravenous contrast. RADIATION DOSE REDUCTION: This exam was performed according to the departmental dose-optimization program which includes automated exposure control, adjustment of the mA and/or kV according to patient size and/or use of iterative reconstruction technique. CONTRAST:  OMNIPAQUE IOHEXOL 300 MG/ML  SOLN COMPARISON:  None Available. FINDINGS: Urinary Tract: Enlarged prostate with mass effect along the base of the bladder. The bladder wall slightly thickened and trabeculated. Please correlate for any evidence of BPH and the patient's PSA. Bowel: The visualized Jeet Shough large bowel in the pelvis is nondilated. Scattered stool. Normal appendix. There is a fluid and air collection to the right of the anal canal measuring 2.4 by 1.7 by 2.0 cm. Adjacent soft tissue edema extending down the gluteal cleft with some skin thickening as well. Findings worrisome for a perianal abscess. No extension more superiorly above the pelvic floor musculature. Vascular/Lymphatic: Prominent lymph  nodes identified along the right inguinal region and right pelvic sidewall. These are less than a cm in short axis and not pathologic by size criteria but could be reactive. Other:  Surgical changes from left hernia repair with mesh. Musculoskeletal: Mild degenerative changes of the visualized lumbar spine at the edge of the imaging field. There is posterior osteophyte formation disc bulging with areas of stenosis. Slight joint space loss of the hips. Degenerative changes of the sacroiliac joints. IMPRESSION: 2.4 x 1.7 x 2.0 cm complex fluid collection with component of air just to the right of the anal canal with some adjacent stranding and thickening in this location as well as  involving the right gluteal cleft. Please correlate for clinical evidence of the perianal abscess. Fistula track is not well defined on this examination. If needed follow up MRI could be considered when clinically appropriate. No extension above the pelvic floor musculature. No extension further into the ischial anal fossa. Enlarged prostate with mass effect along the bladder. Bladder wall is thickened and trabeculated. Please correlate with history of BPH in the patient's PSA Electronically Signed   By: Karen Kays M.D.   On: 11/29/2023 13:44    Procedures Procedures    Medications Ordered in ED Medications  ketorolac (TORADOL) 15 MG/ML injection 15 mg (15 mg Intravenous Given 11/29/23 1034)  iohexol (OMNIPAQUE) 300 MG/ML solution 100 mL (100 mLs Intravenous Contrast Given 11/29/23 1201)  HYDROmorphone (DILAUDID) injection 1 mg (1 mg Intravenous Given 11/29/23 1350)    ED Course/ Medical Decision Making/ A&P                                 Medical Decision Making Patient is a 58 year old male, here for rectal pain, that occurred yesterday.  Has progressively been getting worse is now having difficulty sitting down.  He has some induration, to his rectum, but difficulty performing exam given patient's discomfort.  Will  obtain CT pelvis, for further evaluation given history, do evaluate for possible fistula.  He declined an internal exam.  Amount and/or Complexity of Data Reviewed Labs: ordered.    Details: Mild leukocytosis of 12.4 K Radiology: ordered.    Details: CT shows a 2.4 x 1.7 x 2.0 cm complex fluid collection, to the right of the anal canal with some adjacent stranding and thickening along the gluteal cleft Discussion of management or test interpretation with external provider(s): Patient's findings concerning for perianal abscess, unable to drain at bedside, given patient's severe pain, and the depth of the abscess.  I consulted with Dr. Gerrit Friends, who states he will have the surgical team come down and evaluate the patient, and provide care as necessary.  Patient admitted to Dr. Gerrit Friends, the surgical team, for further management.  Risk Prescription drug management. Decision regarding hospitalization.    Final Clinical Impression(s) / ED Diagnoses Final diagnoses:  Perianal abscess    Rx / DC Orders ED Discharge Orders     None         Kendle Erker, [place], PA 11/29/23 1454    Gloris Manchester, MD 11/29/23 1521

## 2023-11-29 NOTE — H&P (Signed)
 Admission Note  [place]roit Frieden Oct 22, 1965  [id number].    Requesting Provider: Barnie Alderman, PA-C Chief Complaint/Reason for Consult: Perianal abscess  HPI:  Patient is a 58 year old male who presented to UC earlier today with a 2 day history of rectal pain. He reports pressure and sharp pain that is worse with putting any pressure on the area. He has had similar previously on the other side about 4 years ago with perirectal abscess that required I&D. He reports associated decreased appetite. Denies fever, chills, chest pain, SOB, abdominal pain, nausea or vomiting. Patient has some hard stools at baseline secondary to chronic opioid use from back/joint pain. He takes stool softeners daily and last BM was 2 days ago. He does have a history of hemorrhoids as well. PMH otherwise significant for HTN and T2DM. He is not on any blood thinners. He is married and his wife is here with him today. NKDA. For chronic pain he usually takes hydrocodone-acetaminophen 7.5-325 mg TID PRN. He also takes gabapentin, flexeril, robaxin and NSAIDs to help with chronic pain. He is retired. Patient ate some of a sandwich and a few fries about 2 hrs prior to me seeing him.   ROS: Negative other than HPI  Family History  Problem Relation Age of Onset   Dementia Mother    Healthy Mother    Healthy Father     Past Medical History:  Diagnosis Date   Diabetes mellitus without complication (HCC)    Hypertension     Past Surgical History:  Procedure Laterality Date   HERNIA REPAIR     2013    Social History:  reports that he has been smoking cigarettes. He has been exposed to tobacco smoke. He has never used smokeless tobacco. He reports that he does not currently use alcohol. He reports that he does not use drugs.  Allergies: No Known Allergies  (Not in a hospital admission)   Blood pressure 124/87, pulse 67, temperature 99.6 F (37.6 C), temperature source Oral, resp. rate 16, height 6\' 1"   (1.854 m), weight 113 kg, SpO2 96%. Physical Exam:  General: pleasant, WD, WN male who is laying in bed in NAD HEENT: head is normocephalic, atraumatic.  Sclera are noninjected.  Pupils equal and round, EOMI Heart: regular, rate, and rhythm. Palpable radial and pedal pulses bilaterally Lungs: CTAB, no wheezes, rhonchi, or rales noted.  Respiratory effort nonlabored Abd: soft, NT, ND GU: right gluteal erythema and induration without clear fluctuant area, no active drainage, non-thrombosed external hemorrhoids, internal exam deferred MS: all 4 extremities are symmetrical with no cyanosis, clubbing, or edema. Skin: warm and dry with no masses, lesions, or rashes Neuro: Cranial nerves 2-12 grossly intact, sensation is normal throughout Psych: A&Ox3 with an appropriate affect.   Results for orders placed or performed during the hospital encounter of 11/29/23 (from the past 48 hours)  CBC with Differential     Status: Abnormal   Collection Time: 11/29/23 10:29 AM  Result Value Ref Range   WBC 12.4 (H) 4.0 - 10.5 K/uL   RBC 6.18 (H) 4.22 - 5.81 MIL/uL   Hemoglobin 14.8 13.0 - 17.0 g/dL   HCT 40.9 81.1 - 91.4 %   MCV 76.9 (L) 80.0 - 100.0 fL   MCH 23.9 (L) 26.0 - 34.0 pg   MCHC 31.2 30.0 - 36.0 g/dL   RDW 78.2 (H) 95.6 - 21.3 %   Platelets 314 150 - 400 K/uL   nRBC 0.0 0.0 -  0.2 %   Neutrophils Relative % 70 %   Neutro Abs 8.5 (H) 1.7 - 7.7 K/uL   Lymphocytes Relative 20 %   Lymphs Abs 2.5 0.7 - 4.0 K/uL   Monocytes Relative 10 %   Monocytes Absolute 1.3 (H) 0.1 - 1.0 K/uL   Eosinophils Relative 0 %   Eosinophils Absolute 0.1 0.0 - 0.5 K/uL   Basophils Relative 0 %   Basophils Absolute 0.0 0.0 - 0.1 K/uL   Immature Granulocytes 0 %   Abs Immature Granulocytes 0.05 0.00 - 0.07 K/uL    Comment: Performed at [facility], [address] [place], [place] [address]  Basic metabolic panel     Status: None   Collection Time: 11/29/23 10:29 AM  Result Value Ref Range    Sodium 137 135 - 145 mmol/L   Potassium 4.2 3.5 - 5.1 mmol/L   Chloride 102 98 - 111 mmol/L   CO2 25 22 - 32 mmol/L   Glucose, Bld 97 70 - 99 mg/dL    Comment: Glucose reference range applies only to samples taken after fasting for at least 8 hours.   BUN 11 6 - 20 mg/dL   Creatinine, Ser 1.47 0.61 - 1.24 mg/dL   Calcium 9.0 8.9 - 82.9 mg/dL   GFR, Estimated >56 >21 mL/min    Comment: (NOTE) Calculated using the CKD-EPI Creatinine Equation (2021)    Anion gap 10 5 - 15    Comment: Performed at [facility], [address] [place], [place] [address]   CT PELVIS W CONTRAST Result Date: 11/29/2023 CLINICAL DATA:  Perianal fistula suspected.  Rectal pain for 2 days. EXAM: CT PELVIS WITH CONTRAST TECHNIQUE: Multidetector CT imaging of the pelvis was performed using the standard protocol following the bolus administration of intravenous contrast. RADIATION DOSE REDUCTION: This exam was performed according to the departmental dose-optimization program which includes automated exposure control, adjustment of the mA and/or kV according to patient size and/or use of iterative reconstruction technique. CONTRAST:  OMNIPAQUE IOHEXOL 300 MG/ML  SOLN COMPARISON:  None Available. FINDINGS: Urinary Tract: Enlarged prostate with mass effect along the base of the bladder. The bladder wall slightly thickened and trabeculated. Please correlate for any evidence of BPH and the patient's PSA. Bowel: The visualized small large bowel in the pelvis is nondilated. Scattered stool. Normal appendix. There is a fluid and air collection to the right of the anal canal measuring 2.4 by 1.7 by 2.0 cm. Adjacent soft tissue edema extending down the gluteal cleft with some skin thickening as well. Findings worrisome for a perianal abscess. No extension more superiorly above the pelvic floor musculature. Vascular/Lymphatic: Prominent lymph nodes identified along the right inguinal region and right pelvic  sidewall. These are less than a cm in short axis and not pathologic by size criteria but could be reactive. Other:  Surgical changes from left hernia repair with mesh. Musculoskeletal: Mild degenerative changes of the visualized lumbar spine at the edge of the imaging field. There is posterior osteophyte formation disc bulging with areas of stenosis. Slight joint space loss of the hips. Degenerative changes of the sacroiliac joints. IMPRESSION: 2.4 x 1.7 x 2.0 cm complex fluid collection with component of air just to the right of the anal canal with some adjacent stranding and thickening in this location as well as involving the right gluteal cleft. Please correlate for clinical evidence of the perianal abscess. Fistula track is not well defined on this examination. If needed  follow up MRI could be considered when clinically appropriate. No extension above the pelvic floor musculature. No extension further into the ischial anal fossa. Enlarged prostate with mass effect along the bladder. Bladder wall is thickened and trabeculated. Please correlate with history of BPH in the patient's PSA Electronically Signed   By: Karen Kays M.D.   On: 11/29/2023 13:44      Assessment/Plan Perirectal abscess - CT with complex air and fluid collection just to the right of anal canal with some adjacent stranding, enlarged prostate - WBC 12K, afebrile and HD stable  - patient just ate so unable to take to OR now - will admit to observation for pain control and IV abx, make NPO after MN and plan for EUA and I&D tomorrow morning. Anticipate likely discharge post-op.   FEN: reg diet, NPO after MN, NS@75  cc/h at MN VTE: LMWH ID: [email or web address]  T2DM - SSI HTN - home meds Chronic pain - home meds, increased freq of norco given perirectal abscess   I reviewed ED provider notes, last 24 h vitals and pain scores, last 48 h intake and output, last 24 h labs and trends, and last 24 h imaging results.  This care required high   level of medical decision making.   Juliet Rude, [facility] 11/29/2023, 2:56 PM Please see Amion for pager number during day hours 7:00am-4:30pm

## 2023-11-29 NOTE — ED Triage Notes (Signed)
 Patient stated he has hemorrhoids. Over the last 2 days he feels that he got a cyst on his rectum. Had one drained previously.

## 2023-11-29 NOTE — ED Triage Notes (Addendum)
 Pt presents with boil in rectum area x 2 days.

## 2023-11-29 NOTE — ED Notes (Signed)
 Patient is being discharged from the Urgent Care and sent to the Emergency Department via pov . Per J. Defelice, NP, patient is in need of higher level of care due to evaluation of possible peri-rectal abscess. Patient is aware and verbalizes understanding of plan of care.  Vitals:   11/29/23 0848  BP: 115/79  Pulse: 84  Resp: 18  Temp: 98.5 F (36.9 C)  SpO2: 95%

## 2023-11-29 NOTE — Anesthesia Preprocedure Evaluation (Signed)
 Anesthesia Evaluation  Patient identified by MRN, date of birth, ID band Patient awake    Reviewed: Allergy & Precautions, NPO status , Patient's Chart, lab work & pertinent test results  Airway Mallampati: III  TM Distance: >3 FB Neck ROM: Full    Dental no notable dental hx. (+) Upper Dentures, Partial Lower, Dental Advisory Given   Pulmonary Current Smoker and Patient abstained from smoking.   Pulmonary exam normal breath sounds clear to auscultation       Cardiovascular hypertension, Pt. on home beta blockers and Pt. on medications (-) angina (-) Past MI Normal cardiovascular exam Rhythm:Regular Rate:Normal     Neuro/Psych    GI/Hepatic Neg liver ROS,,,  Endo/Other  diabetes, Type 2, Oral Hypoglycemic Agents    Renal/GU negative Renal ROSLab Results      Component                Value               Date                      NA                       137                 11/29/2023                CL                       102                 11/29/2023                K                        4.2                 11/29/2023                CO2                      25                  11/29/2023                BUN                      11                  11/29/2023                CREATININE               1.12                11/29/2023                GFRNONAA                 >60                 11/29/2023                CALCIUM                  9.0  11/29/2023                ALBUMIN                  4.7                 10/20/2021                GLUCOSE                  97                  11/29/2023                Musculoskeletal S/p Ant cervical discectomy   Abdominal   Peds  Hematology Lab Results      Component                Value               Date                      WBC                      10.1                11/29/2023                HGB                      13.6                11/29/2023                 HCT                      45.4                11/29/2023                MCV                      79.4 (L)            11/29/2023                PLT                      286                 11/29/2023              Anesthesia Other Findings   Reproductive/Obstetrics                             Anesthesia Physical Anesthesia Plan  ASA: 3  Anesthesia Plan: General   Post-op Pain Management: Toradol IV (intra-op)* and Ofirmev IV (intra-op)*   Induction: Intravenous  PONV Risk Score and Plan: 2 and Treatment may vary due to age or medical condition, Midazolam, Dexamethasone and Ondansetron  Airway Management Planned: LMA  Additional Equipment: None  Intra-op Plan:   Post-operative Plan:   Informed Consent: I have reviewed the patients History and Physical, chart, labs and discussed the procedure including the risks, benefits and alternatives for the proposed anesthesia with the patient or authorized representative who has indicated his/her understanding and acceptance.  Dental advisory given  Plan Discussed with: CRNA and Surgeon  Anesthesia Plan Comments:        Anesthesia Quick Evaluation

## 2023-11-29 NOTE — ED Provider Notes (Signed)
 [facility]    CSN: [id number] Arrival date & time: 11/29/23  4098      History   Chief Complaint Chief Complaint  Patient presents with   Cyst    HPI Evan Jones is a 58 y.o. male.   58 year old male pt, Evan Jones, presents to urgent care for evaluation of rectal pain x 2 days. Pt is very uncomfortable, reports "10/10" pain, difficulty sitting and walking, didn't sleep last night due to pain.   Patient has past medical history of diabetes, hypertension, hemorrhoids  The history is provided by the patient. No language interpreter was used.    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   Cervical spondylosis 03/08/2023   DDD (degenerative disc disease), cervical 03/08/2023   Arthropathy of lumbar facet joint 01/22/2023   Myelitis (HCC) 10/30/2021   Lower urinary tract symptoms (LUTS) 02/20/2021   Mass of left testicle 02/20/2021   Hemorrhoidal skin tags 01/29/2021   Acute midline low back pain with bilateral sciatica 07/17/2020   Lumbar facet joint pain 07/17/2020   Essential hypertension 07/03/2020   Chronic low back pain 11/23/2019   Erectile dysfunction 11/23/2019   Hypotestosteronemia 11/20/2019   Type 2 diabetes mellitus without complication, without long-term current use of insulin (HCC) 11/20/2019    Past Surgical History:  Procedure Laterality Date   HERNIA REPAIR     2013       Home Medications    Prior to Admission medications   Medication Sig Start Date End Date Taking?  Authorizing Provider  Blood Glucose Monitoring Suppl (FREESTYLE FREEDOM LITE) w/Device KIT Use as directed by physician to check blood sugar 06/24/23  Yes [provider]  gabapentin (NEURONTIN) 300 MG capsule Take 600 mg in the morning and 900 mg at bedtime 12/25/21  Yes Patel, Donika K, DO  glipiZIDE (GLUCOTROL) 5 MG tablet Take by mouth. 03/03/23  Yes [provider]  HYDROcodone-acetaminophen (NORCO/VICODIN) 5-325 MG tablet Take 1 tablet by mouth every 8 (eight) hours as needed. 11/02/23  Yes [provider]  losartan (COZAAR) 50 MG tablet Take by mouth. 10/19/23  Yes [provider]  metFORMIN (GLUCOPHAGE) 1000 MG tablet Take 1,000 mg by mouth 2 (two) times daily. 04/21/21  Yes [provider]  methocarbamol (ROBAXIN) 750 MG tablet Take 1 tablet by mouth 4 (four) times daily as needed. 10/29/23  Yes [provider]  OZEMPIC, 0.25 OR 0.5 MG/DOSE, 2 MG/3ML SOPN Inject into the skin. 11/18/23  Yes [provider]  triamcinolone acetonide (KENALOG-40) 40 MG/ML injection Inject into the articular space. 06/03/23  Yes [provider]  cyclobenzaprine (FLEXERIL) 10 MG tablet Take 10 mg by mouth 3 (three) times daily as needed for muscle spasms. 04/22/20   [provider]  naproxen (NAPROSYN) 250 MG tablet Take 2 tablets (500 mg total) by mouth 2 (two) times daily with a meal. 03/31/20   Hall-Potvin, [place], PA-C  naproxen sodium (ALEVE) 220 MG tablet Take 440 mg by mouth daily.    [provider]  nortriptyline (PAMELOR) 25 MG capsule Take 25 mg by mouth at bedtime as needed for sleep. 02/02/21   [provider]  tadalafil (CIALIS) 5 MG tablet Take 5 mg by mouth daily. 04/10/21   [provider]  telmisartan (MICARDIS) 40 MG tablet Take 40 mg by mouth daily. 04/21/21   [provider]    Family History Family History  Problem Relation Age of Onset   Dementia Mother    Healthy Mother    Healthy  Father     Social History Social History   Tobacco Use   Smoking status: Every Day    Current packs/day: 0.50    Types: Cigarettes    Passive exposure: Current   Smokeless tobacco: Never   Tobacco comments:    Smokes a half a pack a day   Vaping Use   Vaping status: Never Used  Substance Use Topics   Alcohol use: Not Currently   Drug use: Never     Allergies   Patient has no known allergies.   Review of Systems Review of Systems  Constitutional:  Negative for fever.  Gastrointestinal:  Positive for rectal pain.  All other systems reviewed and are negative.    Physical Exam Triage Vital Signs ED Triage Vitals  Encounter Vitals Group     BP 11/29/23 0848 115/79     Systolic BP Percentile --      Diastolic BP Percentile --      Pulse Rate 11/29/23 0848 84     Resp 11/29/23 0848 18     Temp 11/29/23 0848 98.5 F (36.9 C)     Temp Source 11/29/23 0848 Oral     SpO2 11/29/23 0848 95 %     Weight 11/29/23 0847 248 lb (112.5 kg)     Height 11/29/23 0847 6\' 1"  (1.854 m)     Head Circumference --      Peak Flow --      Pain Score 11/29/23 0846 10     Pain Loc --      Pain Education --      Exclude from Growth Chart --    No data found.  Updated Vital Signs BP 115/79 (BP Location: Left Arm)   Pulse 84   Temp 98.5 F (36.9 C) (Oral)   Resp 18   Ht 6\' 1"  (1.854 m)   Wt 248 lb (112.5 kg)   SpO2 95%   BMI 32.72 kg/m   Visual Acuity Right Eye Distance:   Left Eye Distance:   Bilateral Distance:    Right Eye Near:   Left Eye Near:    Bilateral Near:     Physical Exam Vitals and nursing note reviewed. Exam conducted with a chaperone present.  Constitutional:      Appearance: Normal appearance. He is well-developed and well-groomed.  HENT:     Head: Normocephalic.  Cardiovascular:     Rate and Rhythm: Normal rate.  Pulmonary:     Effort: Pulmonary effort is normal.  Genitourinary:    Rectum: Tenderness and external hemorrhoid present.        Comments: RN,Joss, present for exam Neurological:     General: No focal deficit present.     Mental Status: He is alert and oriented to person, place, and time.     GCS: GCS eye subscore is 4. GCS verbal subscore is 5. GCS motor subscore is 6.  Psychiatric:        Attention and Perception: Attention normal.  Mood and Affect: Mood normal.        Speech: Speech normal.        Behavior: Behavior normal. Behavior is cooperative.      UC Treatments / Results  Labs (all labs ordered are listed, but only abnormal results are displayed) Labs Reviewed  POCT FASTING CBG [facility] MANUAL ENTRY - Abnormal; Notable for the following components:      Result Value   POCT Glucose ([facility]) 119 (*)    All other components within normal limits    EKG   Radiology No results found.  Procedures Procedures (including critical care time)  Medications Ordered in UC Medications - No data to display  Initial Impression / Assessment and Plan / UC Course  I have reviewed the triage vital signs and the nursing notes.  Pertinent labs & imaging results that were available during my care of the patient were reviewed by me and considered in my medical decision making (see chart for details).  Clinical Course as of 11/29/23 1001  Mon Nov 29, 2023  0920 Bs is 119 [JD]  [phone] Discussed with pt d/t medical hx,exam findings recommend further evaluation in ER. Pt verbalized understanding to this provider, will go to [facility] ER.  [JD]    Clinical Course User Index [JD] Melitza Metheny, Para March, NP    Ddx: Perirectal abscess, rectal pain,hemorrhoids Final Clinical Impressions(s) / UC Diagnoses   Final diagnoses:  Perirectal abscess  Hemorrhoids, unspecified hemorrhoid type  Rectal pain     Discharge Instructions      Go to ER for further evaluation of perirectal abscess     ED Prescriptions   None    PDMP not reviewed this encounter.   Clancy Gourd, NP 11/29/23 1001

## 2023-11-29 NOTE — Discharge Instructions (Signed)
 Go to ER for further evaluation of perirectal abscess

## 2023-11-30 ENCOUNTER — Encounter (HOSPITAL_COMMUNITY): Payer: Self-pay

## 2023-11-30 ENCOUNTER — Observation Stay (HOSPITAL_BASED_OUTPATIENT_CLINIC_OR_DEPARTMENT_OTHER): Payer: Self-pay | Admitting: Anesthesiology

## 2023-11-30 ENCOUNTER — Encounter (HOSPITAL_COMMUNITY)
Admission: EM | Disposition: A | Discharge status: Home / Self Care | Payer: Self-pay | Source: Home / Self Care | Attending: Emergency Medicine

## 2023-11-30 ENCOUNTER — Observation Stay (HOSPITAL_COMMUNITY): Payer: Self-pay | Admitting: Anesthesiology

## 2023-11-30 DIAGNOSIS — I10 Essential (primary) hypertension: Secondary | ICD-10-CM | POA: Diagnosis not present

## 2023-11-30 DIAGNOSIS — F17210 Nicotine dependence, cigarettes, uncomplicated: Secondary | ICD-10-CM

## 2023-11-30 DIAGNOSIS — K611 Rectal abscess: Secondary | ICD-10-CM | POA: Diagnosis not present

## 2023-11-30 DIAGNOSIS — E119 Type 2 diabetes mellitus without complications: Secondary | ICD-10-CM | POA: Diagnosis not present

## 2023-11-30 HISTORY — PX: INCISION AND DRAINAGE PERIRECTAL ABSCESS: SHX1804

## 2023-11-30 LAB — CBC
HCT: 43.4 % (ref 39.0–52.0)
Hemoglobin: 13.1 g/dL (ref 13.0–17.0)
MCH: 23.8 pg — ABNORMAL LOW (ref 26.0–34.0)
MCHC: 30.2 g/dL (ref 30.0–36.0)
MCV: 78.8 fL — ABNORMAL LOW (ref 80.0–100.0)
Platelets: 275 10*3/uL (ref 150–400)
RBC: 5.51 MIL/uL (ref 4.22–5.81)
RDW: 15.5 % (ref 11.5–15.5)
WBC: 8.6 10*3/uL (ref 4.0–10.5)
nRBC: 0 % (ref 0.0–0.2)

## 2023-11-30 LAB — BASIC METABOLIC PANEL WITH GFR
Anion gap: 6 (ref 5–15)
BUN: 11 mg/dL (ref 6–20)
CO2: 29 mmol/L (ref 22–32)
Calcium: 8.5 mg/dL — ABNORMAL LOW (ref 8.9–10.3)
Chloride: 102 mmol/L (ref 98–111)
Creatinine, Ser: 1.08 mg/dL (ref 0.61–1.24)
GFR, Estimated: >60 mL/min (ref >60)
Glucose, Bld: 107 mg/dL — ABNORMAL HIGH (ref 70–99)
Potassium: 4.7 mmol/L (ref 3.5–5.1)
Sodium: 137 mmol/L (ref 135–145)

## 2023-11-30 LAB — HIV ANTIBODY (ROUTINE TESTING W REFLEX): HIV Screen 4th Generation wRfx: NONREACTIVE

## 2023-11-30 LAB — SURGICAL PCR SCREEN
MRSA, PCR: NEGATIVE
Staphylococcus aureus: NEGATIVE

## 2023-11-30 LAB — GLUCOSE, CAPILLARY
Glucose-Capillary: 107 mg/dL — ABNORMAL HIGH (ref 70–99)
Glucose-Capillary: 105 mg/dL — ABNORMAL HIGH (ref 70–99)
Glucose-Capillary: 140 mg/dL — ABNORMAL HIGH (ref 70–99)

## 2023-11-30 SURGERY — INCISION AND DRAINAGE, ABSCESS, PERIRECTAL
Anesthesia: General

## 2023-11-30 MED ORDER — OXYCODONE HCL 5 MG PO TABS
5.0000 mg | ORAL_TABLET | ORAL | Status: DC | PRN
Start: 2023-11-30 — End: 2023-11-30

## 2023-11-30 MED ORDER — HYDROMORPHONE HCL 1 MG/ML IJ SOLN: 1.0000 mg | INTRAMUSCULAR | Status: DC | PRN

## 2023-11-30 MED ORDER — CHLORHEXIDINE GLUCONATE 0.12 % MT SOLN
15.0000 mL | Freq: Once | OROMUCOSAL | Status: AC
  Administered 2023-11-30: 15 mL via OROMUCOSAL

## 2023-11-30 MED ORDER — ONDANSETRON 4 MG PO TBDP: 4.0000 mg | ORAL_TABLET | Freq: Four times a day (QID) | ORAL | Status: DC | PRN

## 2023-11-30 MED ORDER — ONDANSETRON HCL 4 MG/2ML IJ SOLN: 4.0000 mg | Freq: Four times a day (QID) | INTRAMUSCULAR | Status: DC | PRN

## 2023-11-30 MED ORDER — ACETAMINOPHEN 650 MG RE SUPP: 650.0000 mg | Freq: Four times a day (QID) | RECTAL | Status: DC | PRN

## 2023-11-30 MED ORDER — AMISULPRIDE (ANTIEMETIC) 5 MG/2ML IV SOLN: 10.0000 mg | Freq: Once | INTRAVENOUS | Status: DC | PRN

## 2023-11-30 MED ORDER — PROPOFOL 10 MG/ML IV BOLUS
INTRAVENOUS | Status: DC | PRN
  Administered 2023-11-30: 200 mg via INTRAVENOUS

## 2023-11-30 MED ORDER — OXYCODONE HCL 5 MG PO TABS
5.0000 mg | ORAL_TABLET | Freq: Once | ORAL | Status: AC | PRN
  Administered 2023-11-30: 5 mg via ORAL

## 2023-11-30 MED ORDER — PROPOFOL 10 MG/ML IV BOLUS
INTRAVENOUS | Status: AC
  Filled 2023-11-30: qty 20

## 2023-11-30 MED ORDER — DEXAMETHASONE SODIUM PHOSPHATE 10 MG/ML IJ SOLN
INTRAMUSCULAR | Status: AC
  Filled 2023-11-30: qty 1

## 2023-11-30 MED ORDER — PHENYLEPHRINE 80 MCG/ML (10ML) SYRINGE FOR IV PUSH (FOR BLOOD PRESSURE SUPPORT)
PREFILLED_SYRINGE | INTRAVENOUS | Status: DC | PRN
  Administered 2023-11-30 (×3): 80 ug via INTRAVENOUS

## 2023-11-30 MED ORDER — 0.9 % SODIUM CHLORIDE (POUR BTL) OPTIME
TOPICAL | Status: DC | PRN
  Administered 2023-11-30: 1000 mL

## 2023-11-30 MED ORDER — LIDOCAINE 2% (20 MG/ML) 5 ML SYRINGE
INTRAMUSCULAR | Status: DC | PRN
  Administered 2023-11-30: 100 mg via INTRAVENOUS

## 2023-11-30 MED ORDER — SODIUM CHLORIDE 0.9% FLUSH
3.0000 mL | Freq: Two times a day (BID) | INTRAVENOUS | Status: DC
  Administered 2023-11-30: 5 mL via INTRAVENOUS

## 2023-11-30 MED ORDER — ACETAMINOPHEN 325 MG PO TABS: 650.0000 mg | ORAL_TABLET | Freq: Four times a day (QID) | ORAL | Status: DC | PRN

## 2023-11-30 MED ORDER — ONDANSETRON HCL 4 MG/2ML IJ SOLN: 4.0000 mg | Freq: Once | INTRAMUSCULAR | Status: DC | PRN

## 2023-11-30 MED ORDER — TRAMADOL HCL 50 MG PO TABS: 50.0000 mg | ORAL_TABLET | Freq: Four times a day (QID) | ORAL | Status: DC | PRN

## 2023-11-30 MED ORDER — FENTANYL CITRATE (PF) 250 MCG/5ML IJ SOLN
INTRAMUSCULAR | Status: AC
Start: 2023-11-30 — End: ?
  Filled 2023-11-30: qty 5

## 2023-11-30 MED ORDER — ACETAMINOPHEN 10 MG/ML IV SOLN: 1000.0000 mg | Freq: Once | INTRAVENOUS | Status: DC | PRN

## 2023-11-30 MED ORDER — DEXAMETHASONE SODIUM PHOSPHATE 10 MG/ML IJ SOLN
INTRAMUSCULAR | Status: DC | PRN
  Administered 2023-11-30: 4 mg via INTRAVENOUS

## 2023-11-30 MED ORDER — LIDOCAINE HCL (PF) 2 % IJ SOLN
INTRAMUSCULAR | Status: AC
  Filled 2023-11-30: qty 5

## 2023-11-30 MED ORDER — MIDAZOLAM HCL 5 MG/5ML IJ SOLN
INTRAMUSCULAR | Status: DC | PRN
  Administered 2023-11-30 (×2): 1 mg via INTRAVENOUS

## 2023-11-30 MED ORDER — ONDANSETRON HCL 4 MG/2ML IJ SOLN
INTRAMUSCULAR | Status: AC
  Filled 2023-11-30: qty 2

## 2023-11-30 MED ORDER — SODIUM CHLORIDE 0.9% FLUSH: 3.0000 mL | INTRAVENOUS | Status: DC | PRN

## 2023-11-30 MED ORDER — ONDANSETRON HCL 4 MG/2ML IJ SOLN
INTRAMUSCULAR | Status: DC | PRN
  Administered 2023-11-30: 4 mg via INTRAVENOUS

## 2023-11-30 MED ORDER — OXYCODONE HCL 5 MG/5ML PO SOLN: 5.0000 mg | Freq: Once | ORAL | Status: AC | PRN

## 2023-11-30 MED ORDER — INSULIN ASPART 100 UNIT/ML IJ SOLN
0.0000 [IU] | INTRAMUSCULAR | Status: DC | PRN
Start: 2023-11-30 — End: 2023-11-30

## 2023-11-30 MED ORDER — FENTANYL CITRATE (PF) 250 MCG/5ML IJ SOLN
INTRAMUSCULAR | Status: DC | PRN
Start: 2023-11-30 — End: 2023-11-30
  Administered 2023-11-30: 50 ug via INTRAVENOUS
  Administered 2023-11-30: 100 ug via INTRAVENOUS

## 2023-11-30 MED ORDER — HYDROMORPHONE HCL 1 MG/ML IJ SOLN: 0.2500 mg | INTRAMUSCULAR | Status: DC | PRN

## 2023-11-30 MED ORDER — MIDAZOLAM HCL 2 MG/2ML IJ SOLN
INTRAMUSCULAR | Status: AC
  Filled 2023-11-30: qty 2

## 2023-11-30 MED ORDER — OXYCODONE HCL 5 MG PO TABS
ORAL_TABLET | ORAL | Status: AC
  Filled 2023-11-30: qty 1

## 2023-11-30 MED ORDER — AMOXICILLIN-POT CLAVULANATE 875-125 MG PO TABS: 1.0000 | ORAL_TABLET | Freq: Two times a day (BID) | ORAL | 0 refills | Status: AC

## 2023-11-30 SURGICAL SUPPLY — 22 items
BAG COUNTER SPONGE SURGICOUNT (BAG) IMPLANT
BLADE SURG 15 STRL LF DISP TIS (BLADE) ×1 IMPLANT
BRIEF MESH DISP LRG (UNDERPADS AND DIAPERS) ×1 IMPLANT
COVER SURGICAL LIGHT HANDLE (MISCELLANEOUS) ×1 IMPLANT
DRAIN PENROSE 0.5X18 (DRAIN) IMPLANT
ELECT REM PT RETURN 15FT ADLT (MISCELLANEOUS) ×1 IMPLANT
GAUZE PACKING IODOFORM 1/4X15 (PACKING) IMPLANT
GAUZE PAD ABD 8X10 STRL (GAUZE/BANDAGES/DRESSINGS) IMPLANT
GAUZE SPONGE 4X4 12PLY STRL (GAUZE/BANDAGES/DRESSINGS) ×1 IMPLANT
GAUZE SPONGE 4X4 12PLY STRL LF (GAUZE/BANDAGES/DRESSINGS) IMPLANT
GLOVE SURG ORTHO 8.0 STRL STRW (GLOVE) ×1 IMPLANT
GOWN STRL REUS W/ TWL XL LVL3 (GOWN DISPOSABLE) ×1 IMPLANT
KIT BASIN OR (CUSTOM PROCEDURE TRAY) ×1 IMPLANT
KIT TURNOVER KIT A (KITS) IMPLANT
PACK LITHOTOMY IV (CUSTOM PROCEDURE TRAY) ×1 IMPLANT
PACKING VAGINAL (PACKING) IMPLANT
PENCIL SMOKE EVACUATOR (MISCELLANEOUS) IMPLANT
SURGILUBE 2OZ TUBE FLIPTOP (MISCELLANEOUS) ×1 IMPLANT
SWAB COLLECTION DEVICE MRSA (MISCELLANEOUS) IMPLANT
SWAB CULTURE ESWAB REG 1ML (MISCELLANEOUS) IMPLANT
TOWEL OR 17X26 10 PK STRL BLUE (TOWEL DISPOSABLE) ×1 IMPLANT
UNDERPAD 30X36 HEAVY ABSORB (UNDERPADS AND DIAPERS) ×1 IMPLANT

## 2023-11-30 NOTE — Anesthesia Postprocedure Evaluation (Signed)
 Anesthesia Post Note  Patient: Evan Jones  Procedure(s) Performed: INCISION AND DRAINAGE, ABSCESS, PERIRECTAL     Patient location during evaluation: PACU Anesthesia Type: General Level of consciousness: awake and alert Pain management: pain level controlled Vital Signs Assessment: post-procedure vital signs reviewed and stable Respiratory status: spontaneous breathing, nonlabored ventilation, respiratory function stable and patient connected to nasal cannula oxygen Cardiovascular status: blood pressure returned to baseline and stable Postop Assessment: no apparent nausea or vomiting Anesthetic complications: no  No notable events documented.  Last Vitals:  Vitals:   11/30/23 0900 11/30/23 0924  BP: 132/89 127/81  Pulse: (!) 55 (!) 59  Resp: 11 15  Temp:  36.7 C  SpO2: 98% 99%    Last Pain:  Vitals:   11/30/23 0924  TempSrc: Oral  PainSc: 0-No pain                 Trevor Iha

## 2023-11-30 NOTE — Interval H&P Note (Signed)
 History and Physical Interval Note:  11/30/2023 7:38 AM  Evan Jones  has presented today for surgery, with the diagnosis of perirectal abscess.  The various methods of treatment have been discussed with the patient and family. After consideration of risks, benefits and other options for treatment, the patient has consented to    Procedure(s) with comments: INCISION AND DRAINAGE, ABSCESS, PERIRECTAL (N/A) - EXAM  UNDER ANESTHESIA,  INCISION AND DRAINAGE OF PERIRECTAL ABSCESS as a surgical intervention.    The patient's history has been reviewed, patient examined, no change in status, stable for surgery.  I have reviewed the patient's chart and labs.  Questions were answered to the patient's satisfaction.    Darnell Level, MD [facility] A [facility] practice Office: [phone]   Darnell Level

## 2023-11-30 NOTE — Discharge Instructions (Addendum)
 Home Instructions Following Incision and Drainage of Perirectal Abscess  Wound care - A dressing has been applied to control any bleeding or drainage immediately after your procedure.  You may remove this dressing at your first bowel movement or tomorrow morning, whichever comes first.  There is packing inside your wound as well that should be removed with the dressing.  You do not need to repack the area.  After the dressing is removed, clean the area gently with a mild soap and warm water and place a piece of 100% cotton over the area.  Change to cotton every 1-3 hours while awake to keep the area clean and dry.   - Beginning tomorrow, sit in a tub of warm water for 15-20 minutes at least twice a day and after bowel movements (Sitz baths).  This will help with healing, pain and discomfort. - A small amount of bleeding is to be expected.  If you notice an increase in the bleeding, place a large piece of cotton (about the size of a golf ball) next to the anal opening and sit on a hard surface for 15 minutes.  If the bleeding persists or if you are concerned, please call the office.  Do not sit on rubber rings.  Instead, sit on a soft pillow.    Diet -Eat a regular diet.  Avoid foods that may constipate you or give you diarrhea.  Drink 6-8 glasses of water a day and avoid seeds, nuts and popcorn until the area heals.  Medication -Take pain medication as directed.  Do not drive or operate machinery if you are taking a prescription pain medication.   - We recommend Extra Strength Tylenol for mild to moderate pain.  This can be taken as instructed on the bottle.   - If you are given a prescription for antibiotics, take as instructed by your doctor until the entire course is completed  Bowel Habits Avoid laxatives unless instructed by your doctor. Take a fiber supplement twice a day (Metamucil, FiberCon, Benefiber) Avoid excessive straining to have a bowel movement Do not go for more than 3 days  without a bowel movement.  Take a regular Fleet enema if you are constipated.  Call the office if unable to do this or no results.    Activity Resume activities as tolerated beginning tomorrow.  Avoid strenuous activities or sports for one week.    Call the office if you have any questions.  Call IMMEDIATELY if you should develop persistent heavy rectal bleeding, increase in pain, difficulty urinating or fever greater than 100 F.

## 2023-11-30 NOTE — Plan of Care (Signed)
  Problem: Elimination: Goal: Will not experience complications related to bowel motility Outcome: Progressing Goal: Will not experience complications related to urinary retention Outcome: Progressing   Problem: Metabolic: Goal: Ability to maintain appropriate glucose levels will improve Outcome: Progressing

## 2023-11-30 NOTE — Op Note (Signed)
 Operative Note  Pre-operative Diagnosis:  right peri-rectal abscess  Post-operative Diagnosis:  same  Surgeon:  Darnell Level, MD  Assistant:  none   Procedure:  Exam under anesthesia, incision & drainage of right peri-rectal abscess  Anesthesia:  general  Estimated Blood Loss:  minimal  Drains: none; 1/2 inch iodoform packing         Specimen: aerobic and anaerobic cultures to laboratory  Indications: Patient is a 58 year old male who presents to the emergency department with perineal pain.  White blood cell count was mildly elevated.  CT scan demonstrated evidence of a developing perirectal abscess.  Patient was admitted to the surgical service and started on antibiotics.  He was scheduled for the operating room.  Procedure:  The patient was seen in the pre-op holding area. The risks, benefits, complications, treatment options, and expected outcomes were previously discussed with the patient. The patient agreed with the proposed plan and has signed the informed consent form.  The patient was brought to the operating room by the surgical team, identified as Evan Jones and the procedure verified. A "time out" was completed and the above information confirmed.  Following administration of general anesthesia, the patient is positioned in lithotomy.  Digital rectal examination is performed.  There are multiple skin tags at the anal orifice.  There are no palpable masses.  There is an area of induration on the perineum to the right and anterior of the anal orifice.  Using the electrocautery an incision is made over the area of induration.  Dissection is carried in the subcutaneous tissues and the abscess cavity is entered.  Tract is dilated.  Hemostasis is achieved with the electrocautery.  Using gentle digital dissection the loculations in the abscess cavity are broken down and a moderate volume of purulent fluid is evacuated.  Aerobic and anaerobic cultures are taken.  Cavity is irrigated  with an Asepto syringe with normal saline.  Cavity is then packed with half-inch iodoform gauze packing.  Dry gauze dressings are placed.  Patient is awakened from anesthesia and transferred to the recovery room.  The patient tolerated the procedure well.   Darnell Level, MD [facility] Surgery Office: [phone]

## 2023-11-30 NOTE — Progress Notes (Signed)
 Patient discharged home, IV removed, discharge paperwork provided and explained, patient verbalized understanding.

## 2023-11-30 NOTE — Anesthesia Procedure Notes (Signed)
 Procedure Name: LMA Insertion Date/Time: 11/30/2023 7:51 AM  Performed by: Adria Dill, CRNAPre-anesthesia Checklist: Patient identified, Emergency Drugs available, Suction available and Patient being monitored Patient Re-evaluated:Patient Re-evaluated prior to induction Oxygen Delivery Method: Circle system utilized Preoxygenation: Pre-oxygenation with 100% oxygen Induction Type: IV induction Ventilation: Mask ventilation without difficulty LMA: LMA with gastric port inserted LMA Size: 4.0 Number of attempts: 1 Placement Confirmation: positive ETCO2 and breath sounds checked- equal and bilateral Tube secured with: Tape Dental Injury: Teeth and Oropharynx as per pre-operative assessment

## 2023-11-30 NOTE — Progress Notes (Signed)
 Pt to OR via bed.

## 2023-11-30 NOTE — Transfer of Care (Signed)
 Immediate Anesthesia Transfer of Care Note  Patient: Evan Jones  Procedure(s) Performed: INCISION AND DRAINAGE, ABSCESS, PERIRECTAL  Patient Location: PACU  Anesthesia Type:General  Level of Consciousness: drowsy and patient cooperative  Airway & Oxygen Therapy: Patient Spontanous Breathing and Patient connected to face mask oxygen  Post-op Assessment: Report given to RN and Post -op Vital signs reviewed and stable  Post vital signs: Reviewed and stable  Last Vitals:  Vitals Value Taken Time  BP 126/82 11/30/23 0824  Temp    Pulse 65 11/30/23 0825  Resp 11 11/30/23 0825  SpO2 100 % 11/30/23 0825  Vitals shown include unfiled device data.  Last Pain:  Vitals:   11/30/23 0642  TempSrc: Oral  PainSc: 0-No pain      Patients Stated Pain Goal: 5 (11/30/23 1884)  Complications: No notable events documented.

## 2023-11-30 NOTE — Discharge Summary (Signed)
 [facility] Discharge Summary   Patient ID: Evan Jones MRN: [id number] DOB/AGE: Aug 08, 1966 58 y.o.  Admit date: 11/29/2023 Discharge date: 11/30/2023  Admitting Diagnosis: Perirectal abscess  Discharge Diagnosis S/P I&D of perirectal abscess  Consultants None   Imaging: CT PELVIS W CONTRAST Result Date: 11/29/2023 CLINICAL DATA:  Perianal fistula suspected.  Rectal pain for 2 days. EXAM: CT PELVIS WITH CONTRAST TECHNIQUE: Multidetector CT imaging of the pelvis was performed using the standard protocol following the bolus administration of intravenous contrast. RADIATION DOSE REDUCTION: This exam was performed according to the departmental dose-optimization program which includes automated exposure control, adjustment of the mA and/or kV according to patient size and/or use of iterative reconstruction technique. CONTRAST:  OMNIPAQUE IOHEXOL 300 MG/ML  SOLN COMPARISON:  None Available. FINDINGS: Urinary Tract: Enlarged prostate with mass effect along the base of the bladder. The bladder wall slightly thickened and trabeculated. Please correlate for any evidence of BPH and the patient's PSA. Bowel: The visualized small large bowel in the pelvis is nondilated. Scattered stool. Normal appendix. There is a fluid and air collection to the right of the anal canal measuring 2.4 by 1.7 by 2.0 cm. Adjacent soft tissue edema extending down the gluteal cleft with some skin thickening as well. Findings worrisome for a perianal abscess. No extension more superiorly above the pelvic floor musculature. Vascular/Lymphatic: Prominent lymph nodes identified along the right inguinal region and right pelvic sidewall. These are less than a cm in short axis and not pathologic by size criteria but could be reactive. Other:  Surgical changes from left hernia repair with mesh. Musculoskeletal: Mild degenerative changes of the visualized lumbar spine at the edge of the imaging field. There is posterior  osteophyte formation disc bulging with areas of stenosis. Slight joint space loss of the hips. Degenerative changes of the sacroiliac joints. IMPRESSION: 2.4 x 1.7 x 2.0 cm complex fluid collection with component of air just to the right of the anal canal with some adjacent stranding and thickening in this location as well as involving the right gluteal cleft. Please correlate for clinical evidence of the perianal abscess. Fistula track is not well defined on this examination. If needed follow up MRI could be considered when clinically appropriate. No extension above the pelvic floor musculature. No extension further into the ischial anal fossa. Enlarged prostate with mass effect along the bladder. Bladder wall is thickened and trabeculated. Please correlate with history of BPH in the patient's PSA Electronically Signed   By: Karen Kays M.D.   On: 11/29/2023 13:44    Procedures Dr. Darnell Level (11/30/23) - Incision and drainage, exam under anesthesia  Hospital Course:  Patient is a 58 year old male who presented to the ED with rectal pain x 2 days.  Workup showed perirectal abscess.  Patient was admitted and underwent procedure listed above.  Tolerated procedure well and was transferred to the floor.  Diet was advanced as tolerated.  On POD0, the patient was voiding well, tolerating diet, ambulating well, pain well controlled, vital signs stable and felt stable for discharge home.  Patient will follow up in our office in 2-3 weeks and knows to call with questions or concerns.    I or a member of my team have reviewed this patient in the Controlled Substance Database.   Allergies as of 11/30/2023   No Known Allergies      Medication List     TAKE these medications    amoxicillin-clavulanate 875-125 MG tablet Commonly known  as: AUGMENTIN Take 1 tablet by mouth 2 (two) times daily for 5 days.   cyclobenzaprine 10 MG tablet Commonly known as: FLEXERIL Take 10 mg by mouth 3 (three) times daily  as needed for muscle spasms.   FreeStyle Freedom Lite w/Device Kit Use as directed by physician to check blood sugar   FreeStyle Libre 2 Sensor Misc Inject 1 Device into the skin every 14 (fourteen) days.   gabapentin 600 MG tablet Commonly known as: NEURONTIN Take 600 mg by mouth 3 (three) times daily.   gabapentin 300 MG capsule Commonly known as: NEURONTIN Take 600 mg in the morning and 900 mg at bedtime   HYDROcodone-acetaminophen 7.5-325 MG tablet Commonly known as: NORCO Take 1 tablet by mouth in the morning, at noon, and at bedtime.   Ibuprofen 200 MG Caps Take 200-600 mg by mouth every 6 (six) hours as needed (for pain).   losartan 50 MG tablet Commonly known as: COZAAR Take 50 mg by mouth daily.   metFORMIN 1000 MG tablet Commonly known as: GLUCOPHAGE Take 1,000 mg by mouth 2 (two) times daily with a meal.   methocarbamol 750 MG tablet Commonly known as: ROBAXIN Take 1 tablet by mouth 4 (four) times daily.   naproxen 250 MG tablet Commonly known as: NAPROSYN Take 2 tablets (500 mg total) by mouth 2 (two) times daily with a meal.   Ozempic (0.25 or 0.5 MG/DOSE) 2 MG/3ML Sopn Generic drug: Semaglutide(0.25 or 0.5MG /DOS) Inject 2 mg into the skin every Friday.   tadalafil 5 MG tablet Commonly known as: CIALIS Take 5 mg by mouth daily as needed (as directed).          Follow-up Information     Maczis, Hedda Slade, [place]. Go on 12/16/2023.   Specialty: General Surgery Why: 4:15 PM, please arrive 30 min prior to appointment time to check in. Contact information: [address] [place] [address] [facility] [place] [address] [phone]                 Signed: Juliet Rude , [facility] Surgery 11/30/2023, 1:14 PM Please see Amion for pager number during day hours 7:00am-4:30pm

## 2023-11-30 NOTE — Progress Notes (Signed)
 Patient threatening to sign out against medical advice at 1330 if MD has not placed discharge orders in computer, RN made MD aware of situation, RN educated patient on importance of staying at the hospital until discharge orders are written by the MD, patient continues to insist on signing out against medical advice.

## 2023-12-01 ENCOUNTER — Encounter (HOSPITAL_COMMUNITY): Payer: Self-pay | Admitting: Surgery

## 2023-12-03 ENCOUNTER — Ambulatory Visit
Admission: EM | Admit: 2023-12-03 | Discharge: 2023-12-03 | Disposition: A | Discharge status: Home / Self Care | Attending: Family Medicine | Admitting: Family Medicine

## 2023-12-03 ENCOUNTER — Encounter: Payer: Self-pay | Admitting: Emergency Medicine

## 2023-12-03 ENCOUNTER — Other Ambulatory Visit: Payer: Self-pay

## 2023-12-03 DIAGNOSIS — B349 Viral infection, unspecified: Secondary | ICD-10-CM

## 2023-12-03 LAB — POC COVID19/FLU A&B COMBO
Covid Antigen, POC: NEGATIVE
Influenza A Antigen, POC: NEGATIVE
Influenza B Antigen, POC: NEGATIVE

## 2023-12-03 NOTE — ED Triage Notes (Signed)
 Pt presents with flu like sxs x 3 days. Pt had surgery on Tuesday and came home from hospital not feeling well. Pt want to be tested for Flu.

## 2023-12-03 NOTE — ED Provider Notes (Signed)
 [facility]    CSN: [id number] Arrival date & time: 12/03/23  1254      History   Chief Complaint Chief Complaint  Patient presents with   Influenza    HPI Evan Jones is a 58 y.o. male.    Influenza Presenting symptoms: fatigue and rhinorrhea   Associated symptoms: chills and nasal congestion    Patient is here for not feeling well x 2-3 days.  He is having chills, sweats, weakness.  He is having sinus congestion.  No n/v.  No wheezing or sob.  He had a cyst drained on Tuesday, and felt poorly afterward.  He is on abx currently for that.  He would like me to look at the wound today.  He is augmentin currently.        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/2023   Cervical spondylosis 03/08/2023   DDD (degenerative disc disease), cervical 03/08/2023   Arthropathy of lumbar facet joint 01/22/2023   Myelitis (HCC) 10/30/2021   Lower urinary tract symptoms (LUTS) 02/20/2021   Mass of left testicle 02/20/2021   Hemorrhoidal skin tags 01/29/2021   Acute midline low back pain with bilateral sciatica 07/17/2020   Lumbar facet joint pain 07/17/2020   Essential hypertension 07/03/2020   Chronic low back pain 11/23/2019   Erectile dysfunction 11/23/2019   Hypotestosteronemia 11/20/2019   Type 2 diabetes mellitus without complication, without long-term current use of insulin (HCC) 11/20/2019    Past Surgical History:  Procedure Laterality Date   HERNIA REPAIR      2013   INCISION AND DRAINAGE PERIRECTAL ABSCESS N/A 11/30/2023   Procedure: INCISION AND DRAINAGE, ABSCESS, PERIRECTAL;  Surgeon: Darnell Level, MD;  Location: [facility];  Service: General;  Laterality: N/A;  EXAM  UNDER ANESTHESIA,  INCISION AND DRAINAGE OF PERIRECTAL ABSCESS       Home Medications    Prior to Admission medications   Medication Sig Start Date End Date Taking? Authorizing Provider  amoxicillin-clavulanate (AUGMENTIN) 875-125 MG tablet Take 1 tablet by mouth 2 (two) times daily for 5 days. 11/30/23 12/05/23 Yes Trixie Deis R, PA-C  gabapentin (NEURONTIN) 600 MG tablet Take 600 mg by mouth 3 (three) times daily.   Yes [provider]  HYDROcodone-acetaminophen (NORCO) 7.5-325 MG tablet Take 1 tablet by mouth in the morning, at noon, and at bedtime.   Yes [provider]  losartan (COZAAR) 50 MG tablet Take 50 mg by mouth daily. 10/19/23  Yes [provider]  metFORMIN (GLUCOPHAGE) 1000 MG tablet Take 1,000 mg by mouth 2 (two) times daily with a meal. 04/21/21  Yes [provider]  Blood Glucose Monitoring Suppl (FREESTYLE FREEDOM LITE) w/Device KIT Use as directed by physician to check blood sugar 06/24/23   [provider]  Continuous Glucose Sensor (FREESTYLE LIBRE 2 SENSOR) MISC Inject 1 Device into the skin every 14 (fourteen) days.    [provider]  cyclobenzaprine (FLEXERIL) 10 MG tablet Take 10 mg by mouth 3 (three)  times daily as needed for muscle spasms. 04/22/20   [provider]  gabapentin (NEURONTIN) 300 MG capsule Take 600 mg in the morning and 900 mg at bedtime Patient not taking: Reported on 11/29/2023 12/25/21   Nita Sickle K, DO  Ibuprofen 200 MG CAPS Take 200-600 mg by mouth every 6 (six) hours as needed (for pain).    [provider]  methocarbamol (ROBAXIN) 750 MG tablet Take 1 tablet by mouth 4 (four) times daily. 10/29/23   [provider]  naproxen (NAPROSYN) 250 MG tablet Take  2 tablets (500 mg total) by mouth 2 (two) times daily with a meal. Patient not taking: Reported on 11/29/2023 03/31/20   Hall-Potvin, [place], PA-C  OZEMPIC, 0.25 OR 0.5 MG/DOSE, 2 MG/3ML SOPN Inject 2 mg into the skin every Friday. 11/18/23   [provider]  tadalafil (CIALIS) 5 MG tablet Take 5 mg by mouth daily as needed (as directed). 04/10/21   [provider]    Family History Family History  Problem Relation Age of Onset   Dementia Mother    Healthy Mother    Healthy Father     Social History Social History   Tobacco Use   Smoking status: Every Day    Current packs/day: 0.50    Types: Cigarettes    Passive exposure: Current   Smokeless tobacco: Never   Tobacco comments:    Smokes a half a pack a day   Vaping Use   Vaping status: Never Used  Substance Use Topics   Alcohol use: Not Currently   Drug use: Never     Allergies   Patient has no known allergies.   Review of Systems Review of Systems  Constitutional:  Positive for chills and fatigue.  HENT:  Positive for congestion and rhinorrhea.   Respiratory: Negative.    Cardiovascular: Negative.   Gastrointestinal: Negative.   Genitourinary: Negative.   Musculoskeletal: Negative.   Skin:  Positive for wound.  Psychiatric/Behavioral: Negative.       Physical Exam Triage Vital Signs ED Triage Vitals  Encounter Vitals Group     BP 12/03/23 1317 126/67     Systolic BP Percentile --      Diastolic BP Percentile --      Pulse Rate 12/03/23 1317 78     Resp 12/03/23 1317 18     Temp 12/03/23 1317 99.1 F (37.3 C)     Temp Source 12/03/23 1317 Oral     SpO2 12/03/23 1317 96 %     Weight 12/03/23 1315 248 lb (112.5 kg)     Height 12/03/23 1315 6\' 1"  (1.854 m)     Head Circumference --      Peak Flow --      Pain Score 12/03/23 1315 0     Pain Loc --      Pain Education --      Exclude from Growth Chart --    No data found.  Updated Vital Signs BP 126/67 (BP Location: Left Arm)    Pulse 78   Temp 99.1 F (37.3 C) (Oral)   Resp 18   Ht 6\' 1"  (1.854 m)   Wt 112.5 kg   SpO2 96%   BMI 32.72 kg/m   Visual Acuity Right Eye Distance:   Left Eye Distance:   Bilateral Distance:    Right Eye Near:   Left Eye Near:    Bilateral Near:     Physical Exam Constitutional:  General: He is not in acute distress.    Appearance: Normal appearance. He is normal weight. He is not ill-appearing or toxic-appearing.  HENT:     Head:     Salivary Glands: Right salivary gland is not diffusely enlarged or tender. Left salivary gland is not diffusely enlarged or tender.     Nose: Congestion and rhinorrhea present.  Cardiovascular:     Rate and Rhythm: Normal rate and regular rhythm.  Pulmonary:     Effort: Pulmonary effort is normal.     Breath sounds: Normal breath sounds.  Musculoskeletal:     Cervical back: Normal range of motion and neck supple.  Lymphadenopathy:     Cervical: No cervical adenopathy.  Skin:    Comments: He has a surgical wound to the right medial buttock with packing material present;  the wound is slightly tender, but there is no obvious redness, induration, or signs of superficial infection to the area.   Neurological:     General: No focal deficit present.     Mental Status: He is alert.  Psychiatric:        Mood and Affect: Mood normal.      UC Treatments / Results  Labs (all labs ordered are listed, but only abnormal results are displayed) Labs Reviewed  POC COVID19/FLU A&B COMBO - Normal    EKG   Radiology No results found.  Procedures Procedures (including critical care time)  Medications Ordered in UC Medications - No data to display  Initial Impression / Assessment and Plan / UC Course  I have reviewed the triage vital signs and the nursing notes.  Pertinent labs & imaging results that were available during my care of the patient were reviewed by me and considered in my medical decision making (see chart for  details).   Final Clinical Impressions(s) / UC Diagnoses   Final diagnoses:  Viral syndrome     Discharge Instructions      You were seen today for flu like symptoms.  Your flu and covid testing was negative today.  Your wound appears to be healing well, and is unlikely the cause for your symptoms.  You should continue the antibiotic given for this.  Your symptoms are likely due to another viral illness.  You should take tylenol for any fevers, chills and body aches.  You should get plenty of rest and increase fluids.  Take over the counter claritin/zyrtec for your sinus congestion and drainage.  Please return if you are not improving or worsening.     ED Prescriptions   None    PDMP not reviewed this encounter.   Jannifer Franklin, MD 12/03/23 1343

## 2023-12-03 NOTE — Discharge Instructions (Signed)
 You were seen today for flu like symptoms.  Your flu and covid testing was negative today.  Your wound appears to be healing well, and is unlikely the cause for your symptoms.  You should continue the antibiotic given for this.  Your symptoms are likely due to another viral illness.  You should take tylenol for any fevers, chills and body aches.  You should get plenty of rest and increase fluids.  Take over the counter claritin/zyrtec for your sinus congestion and drainage.  Please return if you are not improving or worsening.

## 2023-12-05 LAB — AEROBIC/ANAEROBIC CULTURE W GRAM STAIN (SURGICAL/DEEP WOUND): Culture: NORMAL

## 2024-05-14 ENCOUNTER — Ambulatory Visit (INDEPENDENT_AMBULATORY_CARE_PROVIDER_SITE_OTHER)

## 2024-05-14 ENCOUNTER — Ambulatory Visit: Admission: EM | Admit: 2024-05-14 | Discharge: 2024-05-14 | Disposition: A | Discharge status: Home / Self Care

## 2024-05-14 ENCOUNTER — Emergency Department (HOSPITAL_COMMUNITY)
Admission: EM | Admit: 2024-05-14 | Discharge: 2024-05-14 | Disposition: A | Discharge status: Home / Self Care | Attending: Emergency Medicine | Admitting: Emergency Medicine

## 2024-05-14 ENCOUNTER — Emergency Department (HOSPITAL_COMMUNITY)

## 2024-05-14 ENCOUNTER — Other Ambulatory Visit: Payer: Self-pay

## 2024-05-14 DIAGNOSIS — F17210 Nicotine dependence, cigarettes, uncomplicated: Secondary | ICD-10-CM | POA: Diagnosis not present

## 2024-05-14 DIAGNOSIS — Z7984 Long term (current) use of oral hypoglycemic drugs: Secondary | ICD-10-CM | POA: Diagnosis not present

## 2024-05-14 DIAGNOSIS — M25512 Pain in left shoulder: Secondary | ICD-10-CM

## 2024-05-14 DIAGNOSIS — X58XXXA Exposure to other specified factors, initial encounter: Secondary | ICD-10-CM | POA: Insufficient documentation

## 2024-05-14 DIAGNOSIS — S46912A Strain of unspecified muscle, fascia and tendon at shoulder and upper arm level, left arm, initial encounter: Secondary | ICD-10-CM | POA: Insufficient documentation

## 2024-05-14 DIAGNOSIS — I10 Essential (primary) hypertension: Secondary | ICD-10-CM | POA: Diagnosis not present

## 2024-05-14 DIAGNOSIS — S4992XA Unspecified injury of left shoulder and upper arm, initial encounter: Secondary | ICD-10-CM | POA: Diagnosis present

## 2024-05-14 DIAGNOSIS — Z79899 Other long term (current) drug therapy: Secondary | ICD-10-CM | POA: Insufficient documentation

## 2024-05-14 DIAGNOSIS — E119 Type 2 diabetes mellitus without complications: Secondary | ICD-10-CM | POA: Insufficient documentation

## 2024-05-14 LAB — CBC
HCT: 45.4 % (ref 39.0–52.0)
Hemoglobin: 13.7 g/dL (ref 13.0–17.0)
MCH: 22.9 pg — ABNORMAL LOW (ref 26.0–34.0)
MCHC: 30.2 g/dL (ref 30.0–36.0)
MCV: 75.8 fL — ABNORMAL LOW (ref 80.0–100.0)
Platelets: 270 K/uL (ref 150–400)
RBC: 5.99 MIL/uL — ABNORMAL HIGH (ref 4.22–5.81)
RDW: 16.5 % — ABNORMAL HIGH (ref 11.5–15.5)
WBC: 6.1 K/uL (ref 4.0–10.5)
nRBC: 0 % (ref 0.0–0.2)

## 2024-05-14 LAB — COMPREHENSIVE METABOLIC PANEL WITH GFR
ALT: 23 U/L (ref 0–44)
AST: 28 U/L (ref 15–41)
Albumin: 4 g/dL (ref 3.5–5.0)
Alkaline Phosphatase: 34 U/L — ABNORMAL LOW (ref 38–126)
Anion gap: 12 (ref 5–15)
BUN: 9 mg/dL (ref 6–20)
CO2: 23 mmol/L (ref 22–32)
Calcium: 9 mg/dL (ref 8.9–10.3)
Chloride: 105 mmol/L (ref 98–111)
Creatinine, Ser: 0.98 mg/dL (ref 0.61–1.24)
GFR, Estimated: >60 mL/min (ref >60)
Glucose, Bld: 98 mg/dL (ref 70–99)
Potassium: 4.4 mmol/L (ref 3.5–5.1)
Sodium: 141 mmol/L (ref 135–145)
Total Bilirubin: 0.2 mg/dL (ref 0.0–1.2)
Total Protein: 6.2 g/dL — ABNORMAL LOW (ref 6.5–8.1)

## 2024-05-14 MED ORDER — PREDNISONE 20 MG PO TABS: 40.0000 mg | ORAL_TABLET | Freq: Every day | ORAL | 0 refills | Status: AC

## 2024-05-14 MED ORDER — CYCLOBENZAPRINE HCL 10 MG PO TABS: 10.0000 mg | ORAL_TABLET | Freq: Two times a day (BID) | ORAL | 0 refills | Status: AC | PRN

## 2024-05-14 NOTE — ED Triage Notes (Signed)
 Patient reports Shoulder pain (Left) after taking out trash yesterday and turning/moving the wrong way. Unable to use/move left arm due to this pain. Hx of injury in same shoulder. No pain medications today.

## 2024-05-14 NOTE — Discharge Instructions (Signed)
   Please follow up with ortho if no improvement or with any further concerns.

## 2024-05-14 NOTE — ED Triage Notes (Addendum)
 Patient to ED by POV with c/o left shoulder dislocation states he dislocated in in 2015 and yesterday when he took the trash out he re injured it.

## 2024-05-14 NOTE — Discharge Instructions (Addendum)
 We evaluated you for your shoulder injury.  We suspect that you have a rotator cuff tear.  Your x-ray did not show any broken bones or dislocation.  Please follow-up with your primary doctor.  The first step for this type of injury is almost always physical therapy.  Please also follow-up with your orthopedic surgeon for further care.  We have given you a sling for comfort, but please do some gentle range of motion exercises at home to prevent stiffness.  You can also apply ice to help with pain.  If you develop any new or worsening symptoms such as severe worsening pain, color change in your arm, swelling in your arm, or any other new symptoms, please return to the emergency department.

## 2024-05-14 NOTE — ED Provider Notes (Signed)
 [place] EMERGENCY DEPARTMENT AT [facility] Provider Note  CSN: [id number] Arrival date & time: 05/14/24 1055  Chief Complaint(s) Shoulder Pain  HPI Evan Jones is a 58 y.o. male history of diabetes, hypertension presenting to the emergency department for left shoulder injury.  Reports about 10 years ago had left shoulder dislocation.  Was worried that he had a similar problem today.  He was taking out the trash, moved his arm funny and then felt pain in the shoulder.  Denies pain in the elbow, hand.  Has had pain moving his left shoulder.  Denies any weakness in his hand or arm.  Denies any falls or other injury.   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) 03/08/2023   Cervical spondylosis 03/08/2023   DDD (degenerative disc disease), cervical 03/08/2023   Arthropathy of lumbar facet joint 01/22/2023   Myelitis (HCC) 10/30/2021   Lower urinary tract symptoms (LUTS) 02/20/2021   Mass of left testicle 02/20/2021   Hemorrhoidal skin tags 01/29/2021   Acute midline low back pain with bilateral sciatica 07/17/2020   Lumbar facet joint pain 07/17/2020   Essential hypertension 07/03/2020   Chronic low back pain 11/23/2019   Erectile dysfunction 11/23/2019   Hypotestosteronemia 11/20/2019   Type 2 diabetes mellitus without complication, without long-term current use of insulin  (HCC) 11/20/2019   Home Medication(s) Prior to Admission medications   Medication Sig  Start Date End Date Taking? Authorizing Provider  Blood Glucose Monitoring Suppl (FREESTYLE FREEDOM LITE) w/Device KIT Use as directed by physician to check blood sugar 06/24/23   [provider]  Continuous Glucose Sensor (FREESTYLE LIBRE 2 SENSOR) MISC Inject 1 Device into the skin every 14 (fourteen) days.    [provider]  cyclobenzaprine  (FLEXERIL ) 10 MG tablet Take 1 tablet (10 mg total) by mouth 2 (two) times daily as needed for muscle spasms. 05/14/24   Billy Asberry FALCON, PA-C  gabapentin  (NEURONTIN ) 300 MG capsule Take 600 mg in the morning and 900 mg at bedtime Patient not taking: Reported on 11/29/2023 12/25/21   Patel, Donika K, DO  gabapentin  (NEURONTIN ) 600 MG tablet Take 600 mg by mouth 3 (three) times daily.    [provider]  HYDROcodone -acetaminophen  (NORCO) 7.5-325 MG tablet Take 1 tablet by mouth in the morning, at noon, and at bedtime.    [provider]  Ibuprofen 200 MG CAPS Take 200-600 mg by mouth every 6 (six) hours as needed (for pain).    [provider]  losartan  (COZAAR ) 50 MG tablet Take 50 mg by mouth daily. 10/19/23   [provider]  meloxicam (MOBIC) 15 MG tablet Take 15 mg by mouth daily. 07/17/20   [provider]  metFORMIN (GLUCOPHAGE) 1000 MG tablet Take 1,000 mg by mouth 2 (two) times daily with a meal. 04/21/21   [provider]  methocarbamol  (ROBAXIN ) 750 MG tablet Take 1 tablet by mouth 4 (four) times daily. 10/29/23   [provider]  naproxen  (NAPROSYN ) 250 MG  tablet Take 2 tablets (500 mg total) by mouth 2 (two) times daily with a meal. Patient not taking: Reported on 11/29/2023 03/31/20   Hall-Potvin, [place], PA-C  OZEMPIC, 0.25 OR 0.5 MG/DOSE, 2 MG/3ML SOPN Inject 2 mg into the skin every Friday. 11/18/23   [provider]  predniSONE  (DELTASONE ) 20 MG tablet Take 2 tablets (40 mg total) by mouth daily with breakfast for 5 days. 05/14/24 05/19/24  Billy Asberry FALCON, PA-C   tadalafil (CIALIS) 5 MG tablet Take 5 mg by mouth daily as needed (as directed). 04/10/21   [provider]  triamcinolone  acetonide (KENALOG-40) 40 MG/ML injection Inject 40 mg into the articular space once. 12/30/23   [provider]                                                                                                                                    Past Surgical History Past Surgical History:  Procedure Laterality Date   HERNIA REPAIR     2013   INCISION AND DRAINAGE PERIRECTAL ABSCESS N/A 11/30/2023   Procedure: INCISION AND DRAINAGE, ABSCESS, PERIRECTAL;  Surgeon: Eletha Boas, MD;  Location: [facility];  Service: General;  Laterality: N/A;  EXAM  UNDER ANESTHESIA,  INCISION AND DRAINAGE OF PERIRECTAL ABSCESS   Family History Family History  Problem Relation Age of Onset   Dementia Mother    Healthy Mother    Healthy Father     Social History Social History   Tobacco Use   Smoking status: Every Day    Current packs/day: 0.50    Types: Cigarettes    Passive exposure: Current   Smokeless tobacco: Never   Tobacco comments:    Smokes a half a pack a day   Vaping Use   Vaping status: Never Used  Substance Use Topics   Alcohol use: Not Currently   Drug use: Never   Allergies Patient has no known allergies.  Review of Systems Review of Systems  All other systems reviewed and are negative.   Physical Exam Vital Signs  I have reviewed the triage vital signs BP (!) 121/94 (BP Location: Right Arm)   Pulse 67   Temp 98.6 F (37 C) (Oral)   Resp 18   Ht 5' 11 (1.803 m)   Wt 117 kg   SpO2 99%   BMI 35.98 kg/m  Physical Exam Vitals and nursing note reviewed.  Constitutional:      General: He is not in acute distress.    Appearance: Normal appearance.  HENT:     Head: Normocephalic and atraumatic.     Mouth/Throat:     Mouth: Mucous membranes are moist.  Eyes:     General: No scleral icterus.    Conjunctiva/sclera: Conjunctivae normal.   Cardiovascular:     Rate and Rhythm: Normal rate.  Pulmonary:     Effort: Pulmonary effort is normal. No respiratory distress.  Abdominal:  General: Abdomen is flat.  Musculoskeletal:     Comments: Intact passive range of motion of the right shoulder.  Painful active range of motion.  No deformity.  Bicep tendon seems intact.  No tenderness over the shoulder or elbow, wrist.  2+ distal radial pulse.  No sensory deficit.  Grip strength 5 out of 5  Skin:    General: Skin is warm and dry.     Capillary Refill: Capillary refill takes less than 2 seconds.  Neurological:     General: No focal deficit present.     Mental Status: He is alert. Mental status is at baseline.  Psychiatric:        Mood and Affect: Mood normal.        Behavior: Behavior normal.     ED Results and Treatments Labs (all labs ordered are listed, but only abnormal results are displayed) Labs Reviewed  COMPREHENSIVE METABOLIC PANEL WITH GFR - Abnormal; Notable for the following components:      Result Value   Total Protein 6.2 (*)    Alkaline Phosphatase 34 (*)    All other components within normal limits  CBC - Abnormal; Notable for the following components:   RBC 5.99 (*)    MCV 75.8 (*)    MCH 22.9 (*)    RDW 16.5 (*)    All other components within normal limits                                                                                                                          Radiology DG Shoulder Left Result Date: 05/14/2024 CLINICAL DATA:  Left shoulder pain EXAM: LEFT SHOULDER - 2+ VIEW COMPARISON:  05/14/2024 at 9:10 a.m. FINDINGS: Mild AC joint spurring. Mild spurring of the humeral head and glenoid. Subacromial morphology is type 2 (curved). No fracture or acute bony findings.  No malalignment. IMPRESSION: 1. Mild degenerative AC joint and glenohumeral arthropathy. Electronically Signed   By: Ryan Salvage M.D.   On: 05/14/2024 12:03   DG Shoulder Left Result Date: 05/14/2024 EXAM: 1 VIEW  XRAY OF THE LEFT SHOULDER 05/14/2024 09:18:04 AM COMPARISON: None available. CLINICAL HISTORY: Shoulder injury. Patient reports shoulder pain (left) after taking out trash yesterday and turning/moving the wrong way. Unable to use/move left arm due to this pain. Hx of injury in same shoulder. FINDINGS: BONES AND JOINTS: Glenohumeral joint is normally aligned. No acute fracture or dislocation. 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 SOFT TISSUES: No abnormal calcifications. Visualized lung is unremarkable. Cervical fixation hardware C5-6. IMPRESSION: 1. No acute findings. 2. 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 Electronically signed by: Katheleen Faes MD 05/14/2024 09:46 AM EDT RP Workstation: [id number]    Pertinent labs & imaging results that were available during my care of the patient were reviewed by me and considered in my medical decision making (see MDM for details).  Medications Ordered in ED Medications - No data to display  Procedures Procedures  (including critical care time)  Medical Decision Making / ED Course   MDM:  58 year old presenting with acute left shoulder pain.  Patient well-appearing, physical examination without focal abnormality.  On exam, patient has painful active range of motion of limited range of motion but passive range of motion intact.  Suspect likely rotator cuff injury.  X-ray obtained with no evidence of any dislocation or fracture.  No evidence of any neurovascular deficit.  No evidence of any other injuries in the left upper extremity.  Patient already has an orthopedic surgeon so recommended follow-up with him.  He declines pain medication.  Also recommended follow-up with primary doctor for physical therapy referral.  Provided sling for comfort and instructed patient  to continue to immobilize his shoulder to prevent stiffness. Will discharge patient to home. All questions answered. Patient comfortable with plan of discharge. Return precautions discussed with patient and specified on the after visit summary.       Additional history obtained:  -External records from outside source obtained and reviewed including: Chart review including previous notes, labs, imaging, consultation notes including prior notes    Lab Tests: -I ordered, reviewed, and interpreted labs.   The pertinent results include:   Labs Reviewed  COMPREHENSIVE METABOLIC PANEL WITH GFR - Abnormal; Notable for the following components:      Result Value   Total Protein 6.2 (*)    Alkaline Phosphatase 34 (*)    All other components within normal limits  CBC - Abnormal; Notable for the following components:   RBC 5.99 (*)    MCV 75.8 (*)    MCH 22.9 (*)    RDW 16.5 (*)    All other components within normal limits    Notable for no anemia or AKI   Imaging Studies ordered: I ordered imaging studies including XR left shoulder  On my interpretation imaging demonstrates arthritis  I independently visualized and interpreted imaging. I agree with the radiologist interpretation   Medicines ordered and prescription drug management: No orders of the defined types were placed in this encounter.   -I have reviewed the patients home medicines and have made adjustments as needed   Social Determinants of Health:  Diagnosis or treatment significantly limited by social determinants of health: obesity   Reevaluation: After the interventions noted above, I reevaluated the patient and found that their symptoms have improved  Co morbidities that complicate the patient evaluation  Past Medical History:  Diagnosis Date   Diabetes mellitus without complication (HCC)    Hypertension       Dispostion: Disposition decision including need for hospitalization was considered, and patient  discharged from emergency department.    Final Clinical Impression(s) / ED Diagnoses Final diagnoses:  Strain of left shoulder, initial encounter     This chart was dictated using voice recognition software.  Despite best efforts to proofread,  errors can occur which can change the documentation meaning.    Francesca Elsie CROME, MD 05/14/24 [phone]

## 2024-05-21 NOTE — ED Provider Notes (Signed)
 [facility]    CSN: [id number] Arrival date & time: 05/14/24  0809      History   Chief Complaint Chief Complaint  Patient presents with   Shoulder Injury    HPI Evan Jones is a 58 y.o. male.   Patient here today for evaluation of left shoulder pain that started after he took out the trash yesterday. He reports the bag was heavy and he moved the wrong way. He has had trouble with lifting his left arm due to pain. He does have history of shoulder injury and would like xray if possible. He denies taking any medication for pain today.   The history is provided by the patient.  Shoulder Injury Pertinent negatives include no shortness of breath.    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/2023   Cervical spondylosis 03/08/2023   DDD (degenerative disc disease), cervical 03/08/2023   Arthropathy of lumbar facet joint 01/22/2023   Myelitis (HCC) 10/30/2021   Lower urinary tract symptoms (LUTS) 02/20/2021   Mass of left testicle 02/20/2021   Hemorrhoidal skin tags 01/29/2021   Acute midline low back pain with bilateral sciatica 07/17/2020   Lumbar facet joint pain 07/17/2020   Essential hypertension 07/03/2020   Chronic low back pain 11/23/2019   Erectile dysfunction 11/23/2019   Hypotestosteronemia 11/20/2019   Type 2 diabetes mellitus without complication, without long-term current use of insulin  (HCC) 11/20/2019    Past Surgical History:  Procedure Laterality  Date   HERNIA REPAIR     2013   INCISION AND DRAINAGE PERIRECTAL ABSCESS N/A 11/30/2023   Procedure: INCISION AND DRAINAGE, ABSCESS, PERIRECTAL;  Surgeon: Eletha Boas, MD;  Location: [facility];  Service: General;  Laterality: N/A;  EXAM  UNDER ANESTHESIA,  INCISION AND DRAINAGE OF PERIRECTAL ABSCESS       Home Medications    Prior to Admission medications   Medication Sig Start Date End Date Taking? Authorizing Provider  cyclobenzaprine  (FLEXERIL ) 10 MG tablet Take 1 tablet (10 mg total) by mouth 2 (two) times daily as needed for muscle spasms. 05/14/24  Yes Billy Asberry FALCON, PA-C  meloxicam (MOBIC) 15 MG tablet Take 15 mg by mouth daily. 07/17/20  Yes [provider]  triamcinolone  acetonide (KENALOG-40) 40 MG/ML injection Inject 40 mg into the articular space once. 12/30/23  Yes [provider]  Blood Glucose Monitoring Suppl (FREESTYLE FREEDOM LITE) w/Device KIT Use as directed by physician to check blood sugar 06/24/23   [provider]  Continuous Glucose Sensor (FREESTYLE LIBRE 2 SENSOR) MISC Inject 1 Device into the skin every 14 (fourteen) days.    [provider]  gabapentin  (NEURONTIN ) 300 MG capsule Take 600 mg in the morning and 900 mg at bedtime Patient not taking: Reported on 11/29/2023 12/25/21   Patel, Donika K, DO  gabapentin  (NEURONTIN ) 600 MG tablet Take 600 mg by mouth 3 (three) times daily.    [provider]  HYDROcodone -acetaminophen  (NORCO) 7.5-325 MG tablet Take 1 tablet by mouth in  the morning, at noon, and at bedtime.    [provider]  Ibuprofen 200 MG CAPS Take 200-600 mg by mouth every 6 (six) hours as needed (for pain).    [provider]  losartan  (COZAAR ) 50 MG tablet Take 50 mg by mouth daily. 10/19/23   [provider]  metFORMIN (GLUCOPHAGE) 1000 MG tablet Take 1,000 mg by mouth 2 (two) times daily with a meal. 04/21/21   [provider]  methocarbamol  (ROBAXIN ) 750 MG tablet Take 1  tablet by mouth 4 (four) times daily. 10/29/23   [provider]  naproxen  (NAPROSYN ) 250 MG tablet Take 2 tablets (500 mg total) by mouth 2 (two) times daily with a meal. Patient not taking: Reported on 11/29/2023 03/31/20   Hall-Potvin, [place], PA-C  OZEMPIC, 0.25 OR 0.5 MG/DOSE, 2 MG/3ML SOPN Inject 2 mg into the skin every Friday. 11/18/23   [provider]  tadalafil (CIALIS) 5 MG tablet Take 5 mg by mouth daily as needed (as directed). 04/10/21   [provider]    Family History Family History  Problem Relation Age of Onset   Dementia Mother    Healthy Mother    Healthy Father     Social History Social History   Tobacco Use   Smoking status: Every Day    Current packs/day: 0.50    Types: Cigarettes    Passive exposure: Current   Smokeless tobacco: Never   Tobacco comments:    Smokes a half a pack a day   Vaping Use   Vaping status: Never Used  Substance Use Topics   Alcohol use: Not Currently   Drug use: Never     Allergies   Patient has no known allergies.   Review of Systems Review of Systems  Constitutional:  Negative for chills and fever.  Eyes:  Negative for discharge and redness.  Respiratory:  Negative for shortness of breath.   Musculoskeletal:  Positive for arthralgias.  Neurological:  Negative for numbness.     Physical Exam Triage Vital Signs ED Triage Vitals  Encounter Vitals Group     BP 05/14/24 0845 137/89     Girls Systolic BP Percentile --      Girls Diastolic BP Percentile --      Boys Systolic BP Percentile --      Boys Diastolic BP Percentile --      Pulse Rate 05/14/24 0845 70     Resp 05/14/24 0845 20     Temp 05/14/24 0845 98.6 F (37 C)     Temp Source 05/14/24 0845 Oral     SpO2 05/14/24 0845 98 %     Weight 05/14/24 0844 256 lb (116.1 kg)     Height 05/14/24 0844 5' 11 (1.803 m)     Head Circumference --      Peak Flow --      Pain Score 05/14/24 0839 9     Pain Loc --      Pain Education --       Exclude from Growth Chart --    No data found.  Updated Vital Signs BP 137/89 (BP Location: Right Arm)   Pulse 70   Temp 98.6 F (37 C) (Oral)   Resp 20   Ht 5' 11 (1.803 m)   Wt 256 lb (116.1 kg)   SpO2 98%   BMI 35.70 kg/m   Visual Acuity Right Eye Distance:   Left Eye Distance:   Bilateral Distance:    Right  Eye Near:   Left Eye Near:    Bilateral Near:     Physical Exam Vitals and nursing note reviewed.  Constitutional:      General: He is not in acute distress.    Appearance: Normal appearance. He is not ill-appearing.  HENT:     Head: Normocephalic and atraumatic.  Eyes:     Conjunctiva/sclera: Conjunctivae normal.  Cardiovascular:     Rate and Rhythm: Normal rate.  Pulmonary:     Effort: Pulmonary effort is normal. No respiratory distress.  Musculoskeletal:     Comments: Decreased ROM of left shoulder, specifically above shoulder height, Mild TTP to anterior shoulder at [facility] joint line  Neurological:     Mental Status: He is alert.     Comments: Grip strength 5/5  Psychiatric:        Mood and Affect: Mood normal.        Behavior: Behavior normal.        Thought Content: Thought content normal.      UC Treatments / Results  Labs (all labs ordered are listed, but only abnormal results are displayed) Labs Reviewed - No data to display  EKG   Radiology No results found.  Procedures Procedures (including critical care time)  Medications Ordered in UC Medications - No data to display  Initial Impression / Assessment and Plan / UC Course  I have reviewed the triage vital signs and the nursing notes.  Pertinent labs & imaging results that were available during my care of the patient were reviewed by me and considered in my medical decision making (see chart for details).    Will treat with steroids to cover possible strain as xray was without acute findings. Recommended follow up with ortho if symptoms persist. Discussed that given  diabetes, glucose may be elevated with steroid use and encouraged patient to monitor.  Final Clinical Impressions(s) / UC Diagnoses   Final diagnoses:  Acute pain of left shoulder  Type 2 diabetes mellitus without complication, without long-term current use of insulin  [facility])     Discharge Instructions        Please follow up with ortho if no improvement or with any further concerns.     ED Prescriptions     Medication Sig Dispense Auth. Provider   predniSONE  (DELTASONE ) 20 MG tablet Take 2 tablets (40 mg total) by mouth daily with breakfast for 5 days. 10 tablet Billy Stabs F, PA-C   cyclobenzaprine  (FLEXERIL ) 10 MG tablet Take 1 tablet (10 mg total) by mouth 2 (two) times daily as needed for muscle spasms. 20 tablet Billy Stabs FALCON, PA-C      PDMP not reviewed this encounter.   Billy Stabs FALCON, PA-C 05/21/24 1241
